# Patient Record
Sex: Male | Born: 1960
Health system: Southern US, Community
[De-identification: ages and names within clinical notes are randomized; demographics above are authoritative.]

## PROBLEM LIST (undated history)

## (undated) DIAGNOSIS — K5190 Ulcerative colitis, unspecified, without complications: Secondary | ICD-10-CM

## (undated) DIAGNOSIS — M109 Gout, unspecified: Secondary | ICD-10-CM

## (undated) DIAGNOSIS — T7840XA Allergy, unspecified, initial encounter: Secondary | ICD-10-CM

## (undated) DIAGNOSIS — I8390 Asymptomatic varicose veins of unspecified lower extremity: Secondary | ICD-10-CM

## (undated) DIAGNOSIS — J45909 Unspecified asthma, uncomplicated: Secondary | ICD-10-CM

## (undated) HISTORY — PX: HERNIA REPAIR: SHX51

## (undated) HISTORY — DX: Allergy, unspecified, initial encounter: T78.40XA

## (undated) HISTORY — DX: Asymptomatic varicose veins of unspecified lower extremity: I83.90

## (undated) HISTORY — DX: Gout, unspecified: M10.9

## (undated) HISTORY — DX: Ulcerative colitis, unspecified, without complications: K51.90

## (undated) HISTORY — DX: Unspecified asthma, uncomplicated: J45.909

---

## 1998-11-19 ENCOUNTER — Ambulatory Visit (HOSPITAL_COMMUNITY): Admission: RE | Admit: 1998-11-19 | Discharge: 1998-11-19 | Payer: Self-pay | Admitting: Gastroenterology

## 2003-03-19 ENCOUNTER — Encounter: Payer: Self-pay | Admitting: Vascular Surgery

## 2003-03-25 ENCOUNTER — Ambulatory Visit (HOSPITAL_COMMUNITY): Admission: RE | Admit: 2003-03-25 | Discharge: 2003-03-25 | Payer: Self-pay | Admitting: Vascular Surgery

## 2011-10-13 ENCOUNTER — Ambulatory Visit (INDEPENDENT_AMBULATORY_CARE_PROVIDER_SITE_OTHER): Payer: 59

## 2011-10-13 DIAGNOSIS — R509 Fever, unspecified: Secondary | ICD-10-CM

## 2011-10-13 DIAGNOSIS — R062 Wheezing: Secondary | ICD-10-CM

## 2011-12-15 ENCOUNTER — Other Ambulatory Visit: Payer: Self-pay | Admitting: Family Medicine

## 2011-12-15 MED ORDER — SILDENAFIL CITRATE 100 MG PO TABS: 100.0000 mg | ORAL_TABLET | Freq: Every day | ORAL | Status: DC | PRN

## 2012-08-31 ENCOUNTER — Other Ambulatory Visit: Payer: Self-pay | Admitting: Internal Medicine

## 2012-09-01 NOTE — Telephone Encounter (Signed)
Please pull paper chart.

## 2012-09-01 NOTE — Telephone Encounter (Signed)
Chart pulled at nurses station pa pool [phone]

## 2012-10-06 ENCOUNTER — Telehealth: Payer: Self-pay | Admitting: *Deleted

## 2012-10-06 MED ORDER — ALLOPURINOL 300 MG PO TABS: 300.0000 mg | ORAL_TABLET | Freq: Every day | ORAL | Status: DC

## 2012-10-06 NOTE — Telephone Encounter (Signed)
Pt walked in after we closed after another pt had walked out.  Spoke with Dr. Lorelei Pont and she made the decision to give the pt 15 day supply and he must have an ov.  Advised pt that he must have an ov before any more refills.

## 2012-10-29 ENCOUNTER — Ambulatory Visit (INDEPENDENT_AMBULATORY_CARE_PROVIDER_SITE_OTHER): Payer: 59 | Admitting: Physician Assistant

## 2012-10-29 VITALS — BP 150/89 | HR 52 | Temp 98.6°F | Resp 12 | Ht 73.0 in | Wt 258.0 lb

## 2012-10-29 DIAGNOSIS — J45909 Unspecified asthma, uncomplicated: Secondary | ICD-10-CM

## 2012-10-29 DIAGNOSIS — M109 Gout, unspecified: Secondary | ICD-10-CM

## 2012-10-29 MED ORDER — ALLOPURINOL 300 MG PO TABS: 300.0000 mg | ORAL_TABLET | Freq: Every day | ORAL | Status: DC

## 2012-10-29 MED ORDER — ALBUTEROL SULFATE HFA 108 (90 BASE) MCG/ACT IN AERS: 2.0000 | INHALATION_SPRAY | Freq: Four times a day (QID) | RESPIRATORY_TRACT | Status: DC | PRN

## 2012-10-29 NOTE — Progress Notes (Signed)
Patient ID: Darren [name] MRN: [id number], DOB: [birth year], 52 y.o. Date of Encounter: 10/29/2012, 4:18 PM  Primary Physician: No primary provider on file.  Chief Complaint: Medication refill  HPI: 52 y.o. year old male with history below presents for medication refill of his allopurinol and proventil.  1) Gout: Doing well. No flares in 1.5-2 years. Takes medication daily without issue. Currently taking allopurinol 300 mg once daily. No adverse effects. He does drink a moderate amount of wine, as he is in the wine business.   2) Asthma: well controlled. Does not require usage of his albuterol inhaler. Just would like to have a refill to have on hand. No exacerbations.  Has not had a CPE in 2 years. Knows he needs to have this done. Does not have time today. Also mentions a rash along the antecubital region of his right arm. Has been present for 1-2 months. Seems to be improving. Not pruritic. Saw his dermatologist. Given a steroid cream. Has been using this. Not worsening, it is just lingering. He is wondering if he got into something in his yard or burned himself. He does not want a workup today. He is just wondering.     Past Medical History  Diagnosis Date  . Asthma      Home Meds: Prior to Admission medications   Medication Sig Start Date End Date Taking? Authorizing Provider  albuterol (PROVENTIL HFA;VENTOLIN HFA) 108 (90 BASE) MCG/ACT inhaler Inhale 2 puffs into the lungs every 6 (six) hours as needed. 10/29/12  Yes  M , PA-C  allopurinol (ZYLOPRIM) 300 MG tablet Take 1 tablet (300 mg total) by mouth daily. 10/29/12  Yes  M , PA-C  sildenafil (VIAGRA) 100 MG tablet Take 1 tablet (100 mg total) by mouth daily as needed for erectile dysfunction. 12/15/11 01/14/12  Chelle Janalee Dane, PA-C    Allergies: No Known Allergies  History   Social History  . Marital Status: Single    Spouse Name: N/A    Number of Children: N/A  . Years of Education: N/A   Occupational  History  . Not on file.   Social History Main Topics  . Smoking status: Never Smoker   . Smokeless tobacco: Not on file  . Alcohol Use: Yes  . Drug Use: Not on file  . Sexually Active: Not on file   Other Topics Concern  . Not on file   Social History Narrative  . No narrative on file     Review of Systems: Constitutional: negative for chills, fever, night sweats, weight changes, or fatigue  HEENT: negative for vision changes, hearing loss, congestion, rhinorrhea, ST, epistaxis, or sinus pressure Cardiovascular: negative for chest pain or palpitations Respiratory: negative for hemoptysis, wheezing, shortness of breath, or cough Abdominal: negative for abdominal pain, nausea, vomiting, or diarrhea Dermatological: Positive for rash All other systems reviewed and negative.   Physical Exam: Blood pressure 150/89, pulse 52, temperature 98.6 F (37 C), temperature source Oral, resp. rate 12, height 6' 1"  (1.854 m), weight 258 lb (117.028 kg), SpO2 99.00%., Body mass index is 34.04 kg/(m^2). BP recheck 131/83 General: Well developed, well nourished, in no acute distress. Head: Normocephalic, atraumatic, eyes without discharge, sclera non-icteric, nares are without discharge. Bilateral auditory canals clear, TM's are without perforation, pearly grey and translucent with reflective cone of light bilaterally. Oral cavity moist, posterior pharynx without exudate, erythema, peritonsillar abscess, or post nasal drip. Uvula midline.  Neck: Supple. No thyromegaly. Full ROM. No lymphadenopathy. Lungs: Clear bilaterally  to auscultation without wheezes, rales, or rhonchi. Breathing is unlabored. Heart: RRR with S1 S2. No murmurs, rubs, or gallops appreciated. Msk:  Strength and tone normal for age. Extremities/Skin: Three erythematous discoid lesions along the right antecubital. Medial most lesion has fungal appearance. No signs of secondary infection. Lateral lesion is mildly erythematous and  patient reports this is improvement and "fading." Skin is intact, warm, and dry.   Neuro: Alert and oriented X 3. Moves all extremities spontaneously. Gait is normal. CNII-XII grossly in tact. Psych:  Responds to questions appropriately with a normal affect.   Labs: CMP and uric acid pending.  Declines KOH.  ASSESSMENT AND PLAN:  52 y.o. year old male with gout, asthma, elevated BP, and rash. 1) Gout -Well controlled -Refilled Allopurinol 300 mg 1 po daily #90 RF 3 -Await labs  2) Asthma -Well controlled -Does not require usage of albuterol inhaler at baseline -Proventil 2 puffs inhaled q 4-6 hours prn #1 RF 2  3) Elevated blood pressure without diagnosis of hypertension -Improved blood pressure on recheck -Monitor -Patient to check at local stores, if remains elevated he is to RTC -Improve diet -Cut down some of the alcohol  -Continue exercise  4) Rash -Has fungal appearance  -Declines KOH -Advised OTC antifungal at this time, if no improvement RTC for further evaluation to include biopsy -Already treated with steroid from dermatology   5) Needs CPE -Last CPE 2 years ago, he agrees to come back in for a CPE and fasting labs. He cannot stay today as he is on his way to the gym -Needs fasting labs -Family history of colon cancer, patient's screenings thus far have all been ok he reports. He believes he may be due for his next screening. Advised him to be on the lookout for a mailing from the GI office. He can also contact the office to find out when his next screening is due.     Signed, Christell Faith, PA-C 10/29/2012 4:18 PM

## 2012-10-30 LAB — COMPREHENSIVE METABOLIC PANEL
ALT: 38 U/L (ref 0–53)
AST: 41 U/L — ABNORMAL HIGH (ref 0–37)
Albumin: 5.1 g/dL (ref 3.5–5.2)
Alkaline Phosphatase: 56 U/L (ref 39–117)
BUN: 17 mg/dL (ref 6–23)
CO2: 29 mEq/L (ref 19–32)
Calcium: 10.2 mg/dL (ref 8.4–10.5)
Chloride: 102 mEq/L (ref 96–112)
Creat: 0.91 mg/dL (ref 0.50–1.35)
Glucose, Bld: 93 mg/dL (ref 70–99)
Potassium: 4.2 mEq/L (ref 3.5–5.3)
Sodium: 143 mEq/L (ref 135–145)
Total Bilirubin: 1 mg/dL (ref 0.3–1.2)
Total Protein: 7.4 g/dL (ref 6.0–8.3)

## 2012-10-30 LAB — URIC ACID: Uric Acid, Serum: 7.8 mg/dL (ref 4.0–7.8)

## 2012-11-02 ENCOUNTER — Telehealth: Payer: Self-pay

## 2012-11-02 NOTE — Telephone Encounter (Signed)
Erroneous encounter

## 2013-04-08 ENCOUNTER — Ambulatory Visit (INDEPENDENT_AMBULATORY_CARE_PROVIDER_SITE_OTHER): Payer: 59 | Admitting: Family Medicine

## 2013-04-08 ENCOUNTER — Ambulatory Visit: Payer: 59

## 2013-04-08 VITALS — BP 143/86 | HR 57 | Temp 97.7°F | Resp 15

## 2013-04-08 DIAGNOSIS — IMO0002 Reserved for concepts with insufficient information to code with codable children: Secondary | ICD-10-CM

## 2013-04-08 DIAGNOSIS — S29011A Strain of muscle and tendon of front wall of thorax, initial encounter: Secondary | ICD-10-CM

## 2013-04-08 DIAGNOSIS — R079 Chest pain, unspecified: Secondary | ICD-10-CM

## 2013-04-08 LAB — POCT CBC
Granulocyte percent: 72.2 %G (ref 37–80)
HCT, POC: 44.5 % (ref 43.5–53.7)
Hemoglobin: 14.4 g/dL (ref 14.1–18.1)
Lymph, poc: 1.2 (ref 0.6–3.4)
MCH, POC: 31.6 pg — AB (ref 27–31.2)
MCHC: 32.4 g/dL (ref 31.8–35.4)
MCV: 97.5 fL — AB (ref 80–97)
MID (cbc): 0.3 (ref 0–0.9)
MPV: 9.1 fL (ref 0–99.8)
POC Granulocyte: 4 (ref 2–6.9)
POC LYMPH PERCENT: 21.6 %L (ref 10–50)
POC MID %: 6.2 %M (ref 0–12)
Platelet Count, POC: 140 10*3/uL — AB (ref 142–424)
RBC: 4.56 M/uL — AB (ref 4.69–6.13)
RDW, POC: 13.7 %
WBC: 5.6 10*3/uL (ref 4.6–10.2)

## 2013-04-08 LAB — COMPREHENSIVE METABOLIC PANEL
ALT: 40 U/L (ref 0–53)
AST: 47 U/L — ABNORMAL HIGH (ref 0–37)
Albumin: 4.5 g/dL (ref 3.5–5.2)
Alkaline Phosphatase: 56 U/L (ref 39–117)
BUN: 15 mg/dL (ref 6–23)
CO2: 28 mEq/L (ref 19–32)
Calcium: 9.7 mg/dL (ref 8.4–10.5)
Chloride: 101 mEq/L (ref 96–112)
Creat: 0.81 mg/dL (ref 0.50–1.35)
Glucose, Bld: 97 mg/dL (ref 70–99)
Potassium: 4.1 mEq/L (ref 3.5–5.3)
Sodium: 140 mEq/L (ref 135–145)
Total Bilirubin: 0.8 mg/dL (ref 0.3–1.2)
Total Protein: 6.4 g/dL (ref 6.0–8.3)

## 2013-04-08 NOTE — Progress Notes (Signed)
[place], [place]  [id number]   [id number]  Subjective:    Patient ID: Darren [name], male    DOB: 06/20/1961, 52 y.o.   MRN: [id number]  HPI This 52 y.o. male presents for evaluation of chest pain.  Onset two hours ago while cutting the grass. Chest pain was L sided pain; describes as a twinge of pain; sharp shooting pains that lasted seconds; had 8 episodes while cutting grass; pain did start while turning mower.  No associated radiation into jaw or shoulder or back; no associated diaphoresis, SOB, nausea.  Took 3 ASA 316m tablets en route.  Severity of pain while mowing grass of 3/10; now 3/10 during visit; pain free now.  Travels a lot with work; last travel six weeks ago.  No leg or calf pain or swelling.   Awoke this morning and took two dogs for a walk; very vigorous walk with dogs without chest pain.  Exercising three days per week on average without chest pain, fatigue, SOB.  Did exercise very vigorously last night; did weights upper body including bench press, push ups; hopes that just pulled a muscle.  Also exercised 30 minutes on elliptical followed by stationary bike.  No tobacco use history.  No family history of heart disease.   Review of Systems  Constitutional: Negative for fever, chills, diaphoresis and fatigue.  Respiratory: Positive for chest tightness. Negative for choking, shortness of breath, wheezing and stridor.   Cardiovascular: Positive for chest pain. Negative for palpitations and leg swelling.  Gastrointestinal: Negative for nausea, vomiting and abdominal pain.  Musculoskeletal: Negative for myalgias, back pain, joint swelling, arthralgias and gait problem.  Skin: Negative for rash.    Past Medical History  Diagnosis Date  . Asthma   . Gout   . Ulcerative colitis     followed by Buccini    Past Surgical History  Procedure Laterality Date  . Hernia repair      Prior to Admission medications   Medication Sig Start Date End Date Taking?  Authorizing Provider  albuterol (PROVENTIL HFA;VENTOLIN HFA) 108 (90 BASE) MCG/ACT inhaler Inhale 2 puffs into the lungs every 6 (six) hours as needed. 10/29/12   RRise Mu PA-C  allopurinol (ZYLOPRIM) 300 MG tablet Take 1 tablet (300 mg total) by mouth daily. 10/29/12   RRise Mu PA-C  sildenafil (VIAGRA) 100 MG tablet Take 1 tablet (100 mg total) by mouth daily as needed for erectile dysfunction. 12/15/11 01/14/12  Chelle SJanalee Dane PA-C    No Known Allergies  History   Social History  . Marital Status: Single    Spouse Name: N/A    Number of Children: N/A  . Years of Education: N/A   Occupational History  . Not on file.   Social History Main Topics  . Smoking status: Never Smoker   . Smokeless tobacco: Not on file  . Alcohol Use: Yes  . Drug Use: Not on file  . Sexually Active: Not on file   Other Topics Concern  . Not on file   Social History Narrative  . No narrative on file    Family History  Problem Relation Age of Onset  . Cancer Father     colon cancer       Objective:   Physical Exam  Nursing note and vitals reviewed. Constitutional: He is oriented to person, place, and time. He appears well-developed and well-nourished. No distress.  Eyes: Conjunctivae are normal. Pupils are equal, round,  and reactive to light.  Neck: Normal range of motion. Neck supple. No JVD present. No thyromegaly present.  Cardiovascular: Normal rate, regular rhythm, normal heart sounds and intact distal pulses.  Exam reveals no gallop and no friction rub.   No murmur heard. Pulmonary/Chest: Effort normal and breath sounds normal. He has no wheezes. He has no rales.  Abdominal: Soft. Bowel sounds are normal. He exhibits no distension and no mass. There is no tenderness. There is no rebound and no guarding.  Musculoskeletal:       Right shoulder: He exhibits pain.       Left shoulder: Normal.       Cervical back: He exhibits pain.  Cervical spine: non-tender; pain in L upper  anterior chest reproducible with rotation to L side;  SHOULDERS: L anterior upper chest pain reproduced with cross over of R arm.  Motor 5/5. Chest wall:  Non-tender along sternoclavicular junction; no TTP along L anterior chest pain.  Lymphadenopathy:    He has no cervical adenopathy.  Neurological: He is alert and oriented to person, place, and time.  Skin: Skin is warm and dry. He is not diaphoretic.  No diaphoresis.  Psychiatric: He has a normal mood and affect. His behavior is normal.   EKG:  Sinus bradycardia at 58; no acute ST changes.  Results for orders placed in visit on 04/08/13  POCT CBC      Result Value Range   WBC 5.6  4.6 - 10.2 K/uL   Lymph, poc 1.2  0.6 - 3.4   POC LYMPH PERCENT 21.6  10 - 50 %L   MID (cbc) 0.3  0 - 0.9   POC MID % 6.2  0 - 12 %M   POC Granulocyte 4.0  2 - 6.9   Granulocyte percent 72.2  37 - 80 %G   RBC 4.56 (*) 4.69 - 6.13 M/uL   Hemoglobin 14.4  14.1 - 18.1 g/dL   HCT, POC 44.5  43.5 - 53.7 %   MCV 97.5 (*) 80 - 97 fL   MCH, POC 31.6 (*) 27 - 31.2 pg   MCHC 32.4  31.8 - 35.4 g/dL   RDW, POC 13.7     Platelet Count, POC 140 (*) 142 - 424 K/uL   MPV 9.1  0 - 99.8 fL   [facility] reading (PRIMARY) by  Dr. Tamala Julian. CXR:  NAD.     Assessment & Plan:  Chest pain - Plan: EKG 12-Lead, Comprehensive metabolic panel, DG Chest 2 View, POCT CBC  Chest Wall Strain  1.  Chest pain:  New.  Atypical; exertion yet onset with range of motion of upper body.  Consistent with chest wall strain following vigorous upper body weight-lifting the night before.  Stable EKG and CXR.  Call 911 for development of chest pain with diaphoresis, SOB, nausea.  Cardiac risk factors include age, obesity.   2.  Chest Wall Strain:  New.  Chest pain reproducible with range of motion of shoulder R and neck.  Recommend rest, heat, range of motion exercises.

## 2013-04-23 ENCOUNTER — Encounter: Payer: Self-pay | Admitting: Internal Medicine

## 2013-05-28 ENCOUNTER — Other Ambulatory Visit: Payer: Self-pay | Admitting: Gastroenterology

## 2013-11-30 ENCOUNTER — Other Ambulatory Visit: Payer: Self-pay | Admitting: Physician Assistant

## 2014-01-06 ENCOUNTER — Other Ambulatory Visit: Payer: Self-pay | Admitting: Physician Assistant

## 2014-01-14 ENCOUNTER — Ambulatory Visit (INDEPENDENT_AMBULATORY_CARE_PROVIDER_SITE_OTHER): Payer: 59 | Admitting: Family Medicine

## 2014-01-14 VITALS — BP 130/80 | HR 68 | Temp 97.6°F | Resp 16 | Ht 72.5 in | Wt 261.0 lb

## 2014-01-14 DIAGNOSIS — R0982 Postnasal drip: Secondary | ICD-10-CM

## 2014-01-14 DIAGNOSIS — J209 Acute bronchitis, unspecified: Secondary | ICD-10-CM

## 2014-01-14 DIAGNOSIS — N529 Male erectile dysfunction, unspecified: Secondary | ICD-10-CM | POA: Insufficient documentation

## 2014-01-14 DIAGNOSIS — K5190 Ulcerative colitis, unspecified, without complications: Secondary | ICD-10-CM | POA: Insufficient documentation

## 2014-01-14 DIAGNOSIS — M109 Gout, unspecified: Secondary | ICD-10-CM

## 2014-01-14 DIAGNOSIS — J019 Acute sinusitis, unspecified: Secondary | ICD-10-CM

## 2014-01-14 MED ORDER — CEFDINIR 300 MG PO CAPS: 300.0000 mg | ORAL_CAPSULE | Freq: Two times a day (BID) | ORAL | Status: DC

## 2014-01-14 MED ORDER — SILDENAFIL CITRATE 100 MG PO TABS: 100.0000 mg | ORAL_TABLET | Freq: Every day | ORAL | Status: DC | PRN

## 2014-01-14 MED ORDER — ALLOPURINOL 300 MG PO TABS: 300.0000 mg | ORAL_TABLET | Freq: Every day | ORAL | Status: DC

## 2014-01-14 MED ORDER — MESALAMINE 800 MG PO TBEC: DELAYED_RELEASE_TABLET | ORAL | Status: DC

## 2014-01-14 MED ORDER — IPRATROPIUM BROMIDE 0.03 % NA SOLN: 2.0000 | Freq: Four times a day (QID) | NASAL | Status: DC

## 2014-01-14 NOTE — Patient Instructions (Signed)
Use the omnicef antibiotic as directed for your sinuses and chest.  Let me know if you are not better soon Try the atrovent nasal spray as needed for post- nasal drip and mucus Continue taking your allopurinol and asacol as usual.

## 2014-01-14 NOTE — Progress Notes (Signed)
[facility] [address], [place] [address] [phone]  Date:  01/14/2014   Name:  Darren [name]   DOB:  [birth year]   MRN:  [id number]  PCP:  No primary provider on file.    Chief Complaint: Sinusitis   History of Present Illness:  Darren [name] is a 53 y.o. very pleasant male patient who presents with the following:  He is here today with illness.  He has PND that is bothering him a lot at night.  He is blowing out and coughing up some green mucus.  He has been sick for about 4 days. He has not noted any fever.  He is able to do his activities but just "feels lousy."  His asthma is still ok, but he is worried that this will get worse.  He has not needed to use his albuterol since he has been sick.  He does have UC; this is under good control currently per Dr. Cristina Gong.  He uses asacol but admits he does not use it quite like prescribed because it is expensive  No history of BPH or glaucoma.   He uses allopurinol for gout and has done so for years with good results.  He also use viagra prn and could use a refill of this  There are no active problems to display for this patient.   Past Medical History  Diagnosis Date  . Asthma   . Gout   . Ulcerative colitis     followed by Buccini  . Varicose veins     Past Surgical History  Procedure Laterality Date  . Hernia repair      History  Substance Use Topics  . Smoking status: Never Smoker   . Smokeless tobacco: Not on file  . Alcohol Use: Yes    Family History  Problem Relation Age of Onset  . Cancer Father     colon cancer    No Known Allergies  Medication list has been reviewed and updated.  Current Outpatient Prescriptions on File Prior to Visit  Medication Sig Dispense Refill  . albuterol (PROVENTIL HFA;VENTOLIN HFA) 108 (90 BASE) MCG/ACT inhaler Inhale 2 puffs into the lungs every 6 (six) hours as needed.  1 Inhaler  2  . allopurinol (ZYLOPRIM) 300 MG tablet Take 1 tablet (300 mg  total) by mouth daily. PATIENT NEEDS OFFICE VISIT FOR ADDITIONAL REFILLS  30 tablet  0  . sildenafil (VIAGRA) 100 MG tablet Take 1 tablet (100 mg total) by mouth daily as needed for erectile dysfunction.  10 tablet  0   No current facility-administered medications on file prior to visit.    Review of Systems:  As per HPI- otherwise negative.   Physical Examination: Filed Vitals:   01/14/14 1305  BP: 130/80  Pulse: 68  Temp: 97.6 F (36.4 C)  Resp: 16   Filed Vitals:   01/14/14 1305  Height: 6' 0.5" (1.842 m)  Weight: 261 lb (118.389 kg)   Body mass index is 34.89 kg/(m^2). Ideal Body Weight: Weight in (lb) to have BMI = 25: 186.5  GEN: WDWN, NAD, Non-toxic, A & O x 3, obese, appears congested HEENT: Atraumatic, Normocephalic. Neck supple. No masses, No LAD.  Bilateral TM wnl, oropharynx normal.  PEERL,EOMI.   Nasal congestion Ears and Nose: No external deformity. CV: RRR, No M/G/R. No JVD. No thrill. No extra heart sounds. PULM: CTA B, no wheezes, crackles, rhonchi. No retractions. No resp. distress. No accessory muscle use. ABD: S,  NT, ND, +BS. No rebound. No HSM. EXTR: No c/c/e NEURO Normal gait.  PSYCH: Normally interactive. Conversant. Not depressed or anxious appearing.  Calm demeanor.    Assessment and Plan: Acute bronchitis - Plan: ipratropium (ATROVENT) 0.03 % nasal spray, cefdinir (OMNICEF) 300 MG capsule  Gout - Plan: allopurinol (ZYLOPRIM) 300 MG tablet  Sinusitis, acute - Plan: cefdinir (OMNICEF) 300 MG capsule  PND (post-nasal drip) - Plan: ipratropium (ATROVENT) 0.03 % nasal spray  Erectile dysfunction - Plan: sildenafil (VIAGRA) 100 MG tablet  Ulcerative colitis, unspecified - Plan: Mesalamine 800 MG TBEC  Discussed with pt.  Refilled medications as above per his request.  Atrovent nasal for PND.  It is likely that he has a viral illness, and he can wait on taking the omnicef for a few more days if desired.  However he does have asthma so he is more  leery of complications.    See patient instructions for more details.      Signed Lamar Blinks, MD

## 2014-06-04 ENCOUNTER — Ambulatory Visit (INDEPENDENT_AMBULATORY_CARE_PROVIDER_SITE_OTHER): Payer: 59 | Admitting: Internal Medicine

## 2014-06-04 VITALS — BP 112/76 | HR 61 | Temp 98.1°F | Resp 20 | Ht 72.5 in | Wt 260.8 lb

## 2014-06-04 DIAGNOSIS — J4521 Mild intermittent asthma with (acute) exacerbation: Secondary | ICD-10-CM

## 2014-06-04 DIAGNOSIS — J45901 Unspecified asthma with (acute) exacerbation: Secondary | ICD-10-CM

## 2014-06-04 MED ORDER — AZITHROMYCIN 500 MG PO TABS: 500.0000 mg | ORAL_TABLET | Freq: Every day | ORAL | Status: DC

## 2014-06-04 MED ORDER — ALBUTEROL SULFATE HFA 108 (90 BASE) MCG/ACT IN AERS: 2.0000 | INHALATION_SPRAY | Freq: Four times a day (QID) | RESPIRATORY_TRACT | Status: DC | PRN

## 2014-06-04 NOTE — Progress Notes (Signed)
Subjective:  This chart was scribed for Tami Lin, MD, by Neta Ehlers, ED Scribe. The patient's care was started at 5:29 PM.   Patient ID: Darren [name], male    DOB: 1961-03-11, 53 y.o.   MRN: [id number]  HPI  HPI Comments: Darren [name] is a 53 y.o. male who presents to the [facility] complaining of chest tightness which has persisted for several days. The pt reports a recent illness which began in his chest. He endorses mild "popping" to the ears and sleep interference. He denies fever, nasal congestion, or SOB. Darren [name] has treated the symptoms with steam and OTC medication.   He denies current seasonal allergies.   He reports a h/o asthma though he has not utilized the inhaler recently and reports the prescription has expired.   He uses allopurinol daily , though he is occasionally noncompliant.  Darren [name] endorses ulcerative colitis which is followed by Dr. Cristina Gong and which is treated with Mesalamine. He denies colectomy. He endorses a family h/o colon cancer.   Past Medical History  Diagnosis Date  . Asthma   . Gout   . Ulcerative colitis     followed by Buccini  . Varicose veins    Past Surgical History  Procedure Laterality Date  . Hernia repair     Current Outpatient Prescriptions on File Prior to Visit  Medication Sig Dispense Refill  . albuterol (PROVENTIL HFA;VENTOLIN HFA) 108 (90 BASE) MCG/ACT inhaler Inhale 2 puffs into the lungs every 6 (six) hours as needed.  1 Inhaler  2  . allopurinol (ZYLOPRIM) 300 MG tablet Take 1 tablet (300 mg total) by mouth daily.  90 tablet  3  . Mesalamine 800 MG TBEC Take 3 tablets twice daily  180 tablet  6  . sildenafil (VIAGRA) 100 MG tablet Take 1 tablet (100 mg total) by mouth daily as needed for erectile dysfunction.  10 tablet  9   No current facility-administered medications on file prior to visit.     Review of Systems See HPI.     Objective:   Physical Exam  Pulmonary/Chest: Effort normal.      Physical Exam  Nursing note and vitals reviewed. Constitutional: He is oriented to person, place, and time. He appears well-developed and well-nourished. No distress.  HENT: TMs intact Head: Normocephalic and atraumatic.  Eyes: Conjunctivae and EOM are normal.  Neck: Neck supple. No adenopathy Throat clear  Cardiovascular: Normal rate.   Pulmonary/Chest: Effort normal. No respiratory distress. Wheezing bilaterally on forced expiration /mild  Musculoskeletal: Normal range of motion.  Neurological: He is alert and oriented to person, place, and time.  Skin: Skin is warm and dry.  Psychiatric: He has a normal mood and affect. His behavior is normal.  Vitals: BP 112/76  Pulse 61  Temp(Src) 98.1 F (36.7 C) (Oral)  Resp 20  Ht 6' 0.5" (1.842 m)  Wt 260 lb 12.8 oz (118.298 kg)  BMI 34.87 kg/m2  SpO2 97%     Assessment & Plan:  The plan includes a Z-pack and an inhaler.  I have completed the patient encounter in its entirety as documented by the scribe, with editing by me where necessary.  P. Laney Pastor, M.D. Asthma, mild intermittent, with acute exacerbation - Plan: albuterol (PROVENTIL HFA;VENTOLIN HFA) 108 (90 BASE) MCG/ACT inhaler  Meds ordered this encounter  Medications  . albuterol (PROVENTIL HFA;VENTOLIN HFA) 108 (90 BASE) MCG/ACT inhaler    Sig: Inhale 2 puffs into the lungs every 6 (six) hours as needed.  Dispense:  1 Inhaler    Refill:  2  . azithromycin (ZITHROMAX) 500 MG tablet    Sig: Take 1 tablet (500 mg total) by mouth daily.    Dispense:  5 tablet    Refill:  0

## 2014-06-10 ENCOUNTER — Telehealth: Payer: Self-pay

## 2014-06-10 NOTE — Telephone Encounter (Signed)
DOOLITTLE - Pt says his antibiotic is not helping him.  Still coughing up stuff and just not feeling well.  Says the z-pack didn't have 2 pills for the first day like most he has taken.  There was only one.  Can we call in something stronger?  [phone] (office) or cell [phone]

## 2014-06-11 ENCOUNTER — Ambulatory Visit (INDEPENDENT_AMBULATORY_CARE_PROVIDER_SITE_OTHER): Payer: 59 | Admitting: Family Medicine

## 2014-06-11 ENCOUNTER — Ambulatory Visit (INDEPENDENT_AMBULATORY_CARE_PROVIDER_SITE_OTHER): Payer: 59

## 2014-06-11 VITALS — BP 126/82 | HR 61 | Temp 98.1°F | Resp 20 | Ht 72.5 in | Wt 262.2 lb

## 2014-06-11 DIAGNOSIS — R05 Cough: Secondary | ICD-10-CM

## 2014-06-11 DIAGNOSIS — R062 Wheezing: Secondary | ICD-10-CM

## 2014-06-11 DIAGNOSIS — R059 Cough, unspecified: Secondary | ICD-10-CM

## 2014-06-11 DIAGNOSIS — J22 Unspecified acute lower respiratory infection: Secondary | ICD-10-CM

## 2014-06-11 DIAGNOSIS — J45909 Unspecified asthma, uncomplicated: Secondary | ICD-10-CM

## 2014-06-11 DIAGNOSIS — J988 Other specified respiratory disorders: Secondary | ICD-10-CM

## 2014-06-11 MED ORDER — LEVOFLOXACIN 500 MG PO TABS: 500.0000 mg | ORAL_TABLET | Freq: Every day | ORAL | Status: DC

## 2014-06-11 MED ORDER — IPRATROPIUM-ALBUTEROL 0.5-2.5 (3) MG/3ML IN SOLN
3.0000 mL | Freq: Once | RESPIRATORY_TRACT | Status: AC
  Administered 2014-06-11: 3 mL via RESPIRATORY_TRACT

## 2014-06-11 NOTE — Telephone Encounter (Signed)
Per OV pt needs to RTC if not improving. LM on cell phone.

## 2014-06-11 NOTE — Progress Notes (Signed)
Chief Complaint:  Chief Complaint  Patient presents with  . Follow-up    finished antibiotics-still coughing up green phelgm/fatigue    HPI: Darren [name] is a 53 y.o. male who is here for  A non improving productive cough x 1 week, he was on a z pack and then also an albuterol inhaler but has not used his inhaler except once, he is complaining of bringing up green sputum, he feels it in his chest and sicne he ahs a hsitory of asthma he is worried about it. No xrays were taken at the last OV. He has had some wheezing but minimal and he states he ahs been exercising. He states his Asthma sxs were much worse when he lived in [place]. He has not had any fevers or chills. He just felt "funny and tired ". He used to be on more meds and more allergies prior to coming down here. 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 Aparrently he was able to be on the elipitical and was not SOB . He states he felt better although he did have some wheezing last night. Again he has only used in albuterol once. He feesl the meds do not help his asthma.    Past Medical History  Diagnosis Date  . Asthma   . Gout   . Ulcerative colitis     followed by Buccini  . Varicose veins    Past Surgical History  Procedure Laterality Date  . Hernia repair     History   Social History  . Marital Status: Single    Spouse Name: N/A    Number of Children: N/A  . Years of Education: N/A   Social History Main Topics  . Smoking status: Never Smoker   . Smokeless tobacco: Never Used  . Alcohol Use: Yes  . Drug Use: No  . Sexual Activity: None   Other Topics Concern  . None   Social History Narrative  . None   Family History  Problem Relation Age of Onset  . Cancer Father     colon cancer   No Known Allergies Prior to Admission medications   Medication Sig Start Date End Date Taking? Authorizing Provider  albuterol (PROVENTIL HFA;VENTOLIN HFA)  108 (90 BASE) MCG/ACT inhaler Inhale 2 puffs into the lungs every 6 (six) hours as needed. 06/04/14  Yes Leandrew Koyanagi, MD  allopurinol (ZYLOPRIM) 300 MG tablet Take 1 tablet (300 mg total) by mouth daily. 01/14/14  Yes Darreld Mclean, MD  Mesalamine 800 MG TBEC Take 2 tablets daily 01/14/14  Yes Gay Filler Copland, MD  sildenafil (VIAGRA) 100 MG tablet Take 1 tablet (100 mg total) by mouth daily as needed for erectile dysfunction. 01/14/14 02/13/14  Gay Filler Copland, MD     ROS: The patient denies fevers, chills, night sweats, unintentional weight loss, chest pain, palpitations, dyspnea on exertion, nausea, vomiting, abdominal pain, dysuria, hematuria, melena, numbness, weakness, or tingling.   All other systems have been reviewed and were otherwise negative with the exception of those mentioned in the HPI and as above.    PHYSICAL EXAM: Filed Vitals:   06/11/14 1815  BP: 126/82  Pulse: 61  Temp: 98.1 F (36.7 C)  Resp: 20  SPo2 95% Filed Vitals:   06/11/14 1815  Height: 6' 0.5" (1.842 m)  Weight: 262 lb 4 oz (118.956 kg)   Body mass  index is 35.06 kg/(m^2).  General: Alert, no acute distress HEENT:  Normocephalic, atraumatic, oropharynx patent. EOMI, PERRLA. Tm nl, no exudates, he ahs some moderate PND Cardiovascular:  Regular rate and rhythm, no rubs murmurs or gallops.  No Carotid bruits, radial pulse intact. No pedal edema.  Respiratory: Clear to auscultation bilaterally.  No wheezes, rales, or rhonchi.  No cyanosis, no use of accessory musculature. Decrease BS.  GI: No organomegaly, abdomen is soft and non-tender, positive bowel sounds.  No masses. Skin: No rashes. Neurologic: Facial musculature symmetric. Psychiatric: Patient is appropriate throughout our interaction. Lymphatic: No cervical lymphadenopathy Musculoskeletal: Gait intact. 5/5 strength He has no pedal edema, he does have large varicose veins.    LABS: Results for orders placed in visit on 04/08/13    COMPREHENSIVE METABOLIC PANEL      Result Value Ref Range   Sodium 140  135 - 145 mEq/L   Potassium 4.1  3.5 - 5.3 mEq/L   Chloride 101  96 - 112 mEq/L   CO2 28  19 - 32 mEq/L   Glucose, Bld 97  70 - 99 mg/dL   BUN 15  6 - 23 mg/dL   Creat 0.81  0.50 - 1.35 mg/dL   Total Bilirubin 0.8  0.3 - 1.2 mg/dL   Alkaline Phosphatase 56  39 - 117 U/L   AST 47 (*) 0 - 37 U/L   ALT 40  0 - 53 U/L   Total Protein 6.4  6.0 - 8.3 g/dL   Albumin 4.5  3.5 - 5.2 g/dL   Calcium 9.7  8.4 - 10.5 mg/dL  POCT CBC      Result Value Ref Range   WBC 5.6  4.6 - 10.2 K/uL   Lymph, poc 1.2  0.6 - 3.4   POC LYMPH PERCENT 21.6  10 - 50 %L   MID (cbc) 0.3  0 - 0.9   POC MID % 6.2  0 - 12 %M   POC Granulocyte 4.0  2 - 6.9   Granulocyte percent 72.2  37 - 80 %G   RBC 4.56 (*) 4.69 - 6.13 M/uL   Hemoglobin 14.4  14.1 - 18.1 g/dL   HCT, POC 44.5  43.5 - 53.7 %   MCV 97.5 (*) 80 - 97 fL   MCH, POC 31.6 (*) 27 - 31.2 pg   MCHC 32.4  31.8 - 35.4 g/dL   RDW, POC 13.7     Platelet Count, POC 140 (*) 142 - 424 K/uL   MPV 9.1  0 - 99.8 fL     EKG/XRAY:   Primary read interpreted by Dr. Marin Comment at [facility]. No active cardiopulmonary disease when compared to 04/08/2013   ASSESSMENT/PLAN: Encounter Diagnoses  Name Primary?  . Wheezing   . Cough   . Lower respiratory infection Yes  . Asthma, chronic, unspecified asthma severity, uncomplicated    Rx Levaquin 500 mg x  days Take albuterol scheduled until he feel better Spo2 went from 95% to 98%  and BS were improved after1 duoneb treatment  F/u prn  Gross sideeffects, risk and benefits, and alternatives of medications d/w patient. Patient is aware that all medications have potential sideeffects and we are unable to predict every sideeffect or drug-drug interaction that may occur.  , Evan, DO 06/12/2014 2:17 PM

## 2014-11-28 ENCOUNTER — Ambulatory Visit (INDEPENDENT_AMBULATORY_CARE_PROVIDER_SITE_OTHER): Payer: BLUE CROSS/BLUE SHIELD | Admitting: Physician Assistant

## 2014-11-28 VITALS — BP 130/80 | HR 51 | Temp 98.2°F | Resp 17 | Ht 72.5 in | Wt 257.0 lb

## 2014-11-28 DIAGNOSIS — I8393 Asymptomatic varicose veins of bilateral lower extremities: Secondary | ICD-10-CM

## 2014-11-28 DIAGNOSIS — M1000 Idiopathic gout, unspecified site: Secondary | ICD-10-CM

## 2014-11-28 DIAGNOSIS — Z130 Encounter for screening for diseases of the blood and blood-forming organs and certain disorders involving the immune mechanism: Secondary | ICD-10-CM

## 2014-11-28 DIAGNOSIS — J4521 Mild intermittent asthma with (acute) exacerbation: Secondary | ICD-10-CM

## 2014-11-28 DIAGNOSIS — Z125 Encounter for screening for malignant neoplasm of prostate: Secondary | ICD-10-CM

## 2014-11-28 DIAGNOSIS — J4520 Mild intermittent asthma, uncomplicated: Secondary | ICD-10-CM | POA: Insufficient documentation

## 2014-11-28 DIAGNOSIS — Z13228 Encounter for screening for other metabolic disorders: Secondary | ICD-10-CM

## 2014-11-28 DIAGNOSIS — N529 Male erectile dysfunction, unspecified: Secondary | ICD-10-CM

## 2014-11-28 DIAGNOSIS — Z0000 Encounter for general adult medical examination without abnormal findings: Secondary | ICD-10-CM

## 2014-11-28 DIAGNOSIS — Z1329 Encounter for screening for other suspected endocrine disorder: Secondary | ICD-10-CM

## 2014-11-28 DIAGNOSIS — Z13220 Encounter for screening for lipoid disorders: Secondary | ICD-10-CM

## 2014-11-28 LAB — LIPID PANEL
Cholesterol: 141 mg/dL (ref 0–200)
HDL: 37 mg/dL — ABNORMAL LOW (ref >39)
LDL Cholesterol: 84 mg/dL (ref 0–99)
Total CHOL/HDL Ratio: 3.8 Ratio
Triglycerides: 98 mg/dL (ref <150)
VLDL: 20 mg/dL (ref 0–40)

## 2014-11-28 LAB — CBC WITH DIFFERENTIAL/PLATELET
Basophils Absolute: 0 10*3/uL (ref 0.0–0.1)
Basophils Relative: 0 % (ref 0–1)
Eosinophils Absolute: 0.3 10*3/uL (ref 0.0–0.7)
Eosinophils Relative: 7 % — ABNORMAL HIGH (ref 0–5)
HCT: 45.9 % (ref 39.0–52.0)
Hemoglobin: 15.4 g/dL (ref 13.0–17.0)
Lymphocytes Relative: 24 % (ref 12–46)
Lymphs Abs: 1.1 10*3/uL (ref 0.7–4.0)
MCH: 30.4 pg (ref 26.0–34.0)
MCHC: 33.6 g/dL (ref 30.0–36.0)
MCV: 90.5 fL (ref 78.0–100.0)
MPV: 10.4 fL (ref 8.6–12.4)
Monocytes Absolute: 0.3 10*3/uL (ref 0.1–1.0)
Monocytes Relative: 7 % (ref 3–12)
Neutro Abs: 2.8 10*3/uL (ref 1.7–7.7)
Neutrophils Relative %: 62 % (ref 43–77)
Platelets: 171 10*3/uL (ref 150–400)
RBC: 5.07 MIL/uL (ref 4.22–5.81)
RDW: 14 % (ref 11.5–15.5)
WBC: 4.5 10*3/uL (ref 4.0–10.5)

## 2014-11-28 LAB — COMPLETE METABOLIC PANEL WITH GFR
ALT: 74 U/L — ABNORMAL HIGH (ref 0–53)
AST: 88 U/L — ABNORMAL HIGH (ref 0–37)
Albumin: 5 g/dL (ref 3.5–5.2)
Alkaline Phosphatase: 64 U/L (ref 39–117)
BUN: 18 mg/dL (ref 6–23)
CO2: 31 mEq/L (ref 19–32)
Calcium: 10 mg/dL (ref 8.4–10.5)
Chloride: 103 mEq/L (ref 96–112)
Creat: 0.97 mg/dL (ref 0.50–1.35)
GFR, Est African American: >89 mL/min
GFR, Est Non African American: 88 mL/min
Glucose, Bld: 95 mg/dL (ref 70–99)
Potassium: 4.4 mEq/L (ref 3.5–5.3)
Sodium: 142 mEq/L (ref 135–145)
Total Bilirubin: 1.2 mg/dL (ref 0.2–1.2)
Total Protein: 7.5 g/dL (ref 6.0–8.3)

## 2014-11-28 LAB — TSH: TSH: 2.317 u[IU]/mL (ref 0.350–4.500)

## 2014-11-28 MED ORDER — ALLOPURINOL 300 MG PO TABS
300.0000 mg | ORAL_TABLET | Freq: Every day | ORAL | Status: DC
Start: 2014-11-28 — End: 2015-07-16

## 2014-11-28 MED ORDER — ALBUTEROL SULFATE HFA 108 (90 BASE) MCG/ACT IN AERS: 2.0000 | INHALATION_SPRAY | Freq: Four times a day (QID) | RESPIRATORY_TRACT | Status: DC | PRN

## 2014-11-28 MED ORDER — SILDENAFIL CITRATE 100 MG PO TABS: 50.0000 mg | ORAL_TABLET | Freq: Every day | ORAL | Status: DC | PRN

## 2014-11-28 NOTE — Progress Notes (Signed)
  Medical screening examination/treatment/procedure(s) were performed by non-physician practitioner and as supervising physician I was immediately available for consultation/collaboration.     

## 2014-11-28 NOTE — Patient Instructions (Signed)
I will contact you with your lab results as soon as they are available.   If you have not heard from me in 2 weeks, please contact me.  The fastest way to get your results is to register for My Chart (see the instructions on the last page of this printout).

## 2014-11-28 NOTE — Progress Notes (Signed)
Subjective:    Patient ID: Darren [name], male    DOB: 01-Sep-1961, 54 y.o.   MRN: [id number]  HPI Pt presents to clinic for his complete PE.  He has just gotten new insurance and they want him to be evaluated.  He has UC and sees Dr Cristina Gong.  He has erectile dysfunction and sometimes uses Viagra. He has gout and uses Allopurinol every 3 days and then he does not have acute attacks.  He states he rarely uses medications as prescribed bu rather how they work best for him.  He has had varicose veins and had surgery but he has no pain and he does not plan to have his legs fixed for their bad looks.  Eye exam within the last year. Dentist - in January Colonoscopy - 2014  Review of Systems  Constitutional: Negative.   HENT: Negative.   Eyes: Negative.   Respiratory: Negative.   Cardiovascular: Negative.   Gastrointestinal: Negative.   Endocrine: Negative.   Genitourinary: Negative.   Musculoskeletal: Negative.   Skin: Negative.   Allergic/Immunologic: Negative.   Neurological: Negative.   Hematological: Negative.   Psychiatric/Behavioral: Negative.    Patient Active Problem List   Diagnosis Date Noted  . Asthma, mild intermittent 11/28/2014  . Ulcerative colitis 01/14/2014  . Gout 01/14/2014  . Erectile dysfunction 01/14/2014   Prior to Admission medications   Medication Sig Start Date End Date Taking? Authorizing Provider  allopurinol (ZYLOPRIM) 300 MG tablet Take 1 tablet (300 mg total) by mouth daily. 11/28/14  Yes Mancel Bale, PA-C  Mesalamine 800 MG TBEC Take 2 tablets daily 01/14/14  Yes Gay Filler Copland, MD  albuterol (PROVENTIL HFA;VENTOLIN HFA) 108 (90 BASE) MCG/ACT inhaler Inhale 2 puffs into the lungs every 6 (six) hours as needed. 11/28/14   Mancel Bale, PA-C  sildenafil (VIAGRA) 100 MG tablet Take 0.5-1 tablets (50-100 mg total) by mouth daily as needed for erectile dysfunction. 11/28/14   Mancel Bale, PA-C   No Known Allergies  Medications, allergies, past  medical history, surgical history, family history, social history and problem list reviewed and updated.     Objective:   Physical Exam  Constitutional: He is oriented to person, place, and time. He appears well-developed and well-nourished.  BP 130/80 mmHg  Pulse 51  Temp(Src) 98.2 F (36.8 C) (Oral)  Resp 17  Ht 6' 0.5" (1.842 m)  Wt 257 lb (116.574 kg)  BMI 34.36 kg/m2  SpO2 98%   HENT:  Head: Normocephalic and atraumatic.  Right Ear: Hearing, tympanic membrane, external ear and ear canal normal.  Left Ear: Hearing, tympanic membrane, external ear and ear canal normal.  Nose: Nose normal.  Mouth/Throat: Uvula is midline, oropharynx is clear and moist and mucous membranes are normal.  Eyes: Conjunctivae and EOM are normal. Pupils are equal, round, and reactive to light.  Neck: Normal range of motion. Neck supple. No thyromegaly present.  Cardiovascular: Normal rate, regular rhythm and normal heart sounds.   No murmur heard. Pulmonary/Chest: Effort normal and breath sounds normal.  Abdominal: Soft. Bowel sounds are normal.  Genitourinary: Rectum normal, prostate normal and testes normal. Circumcised.  Musculoskeletal: Normal range of motion.  Lymphadenopathy:    He has no cervical adenopathy.  Neurological: He is alert and oriented to person, place, and time.  Skin: Skin is warm and dry.  Psychiatric: He has a normal mood and affect. His behavior is normal. Judgment and thought content normal.      Assessment &  Plan:  Annual physical exam - form will be filled out when his results come back in from the lab  Acute idiopathic gout, unspecified site - Continue the plan Plan: allopurinol (ZYLOPRIM) 300 MG tablet  Screening for cholesterol level - Plan: Lipid panel  Screening for prostate cancer - Plan: PSA  Screening for metabolic disorder - Plan: COMPLETE METABOLIC PANEL WITH GFR  Screening for deficiency anemia - Plan: CBC with Differential/Platelet  Screening for  thyroid disorder - Plan: TSH  Asthma, mild intermittent, with acute exacerbation - Plan: albuterol (PROVENTIL HFA;VENTOLIN HFA) 108 (90 BASE) MCG/ACT inhaler  Impotence - Plan: sildenafil (VIAGRA) 100 MG tablet  Varicose veins of both lower extremities - pt does not want treatment at this time because he has no pain or problems.  Windell Hummingbird PA-C  [facility] 11/28/2014 3:30 PM

## 2014-11-29 LAB — PSA: PSA: 1.88 ng/mL (ref <=4.00)

## 2015-01-21 ENCOUNTER — Ambulatory Visit (INDEPENDENT_AMBULATORY_CARE_PROVIDER_SITE_OTHER): Payer: BLUE CROSS/BLUE SHIELD | Admitting: Physician Assistant

## 2015-01-21 VITALS — BP 128/84 | HR 52 | Temp 97.4°F | Resp 16 | Ht 72.5 in | Wt 262.0 lb

## 2015-01-21 DIAGNOSIS — H938X1 Other specified disorders of right ear: Secondary | ICD-10-CM | POA: Diagnosis not present

## 2015-01-21 DIAGNOSIS — H6121 Impacted cerumen, right ear: Secondary | ICD-10-CM | POA: Diagnosis not present

## 2015-01-21 DIAGNOSIS — H9191 Unspecified hearing loss, right ear: Secondary | ICD-10-CM | POA: Diagnosis not present

## 2015-01-21 NOTE — Progress Notes (Signed)
Subjective:    Patient ID: Darren [name], male    DOB: 1960/12/13, 54 y.o.   MRN: [id number]  HPI  This is a 54 year old male who is presenting with right ear fullness x 4 days. Feels like he has pressure in his ear. He is having decreased hearing. Has poor hearing in left ear since age 12 and is worried about his right ear.  Has some minimal nasal congestion. Has environmental allergies but not bothered too bad now. Not taking anything for allergies. Pt has a history of asthma but only uses albuterol when sick. Exercises without problems breathing. He denies fever, chills, sore throat, cough.  Review of Systems  Constitutional: Negative for fever and chills.  HENT: Positive for congestion and hearing loss. Negative for ear discharge, ear pain, postnasal drip, sinus pressure and sore throat.        Ear fullness  Eyes: Negative for redness.  Respiratory: Negative for cough, shortness of breath and wheezing.   Gastrointestinal: Negative for nausea and vomiting.  Skin: Negative for rash.  Allergic/Immunologic: Positive for environmental allergies.  Hematological: Negative for adenopathy.    Patient Active Problem List   Diagnosis Date Noted  . Asthma, mild intermittent 11/28/2014  . Varicose veins of both lower extremities 11/28/2014  . Ulcerative colitis 01/14/2014  . Gout 01/14/2014  . Erectile dysfunction 01/14/2014   Prior to Admission medications   Medication Sig Start Date End Date Taking? Authorizing Provider  albuterol (PROVENTIL HFA;VENTOLIN HFA) 108 (90 BASE) MCG/ACT inhaler Inhale 2 puffs into the lungs every 6 (six) hours as needed. 11/28/14  Yes Mancel Bale, PA-C  allopurinol (ZYLOPRIM) 300 MG tablet Take 1 tablet (300 mg total) by mouth daily. 11/28/14  Yes Mancel Bale, PA-C  Mesalamine 800 MG TBEC Take 2 tablets daily 01/14/14  Yes Gay Filler Copland, MD  sildenafil (VIAGRA) 100 MG tablet Take 0.5-1 tablets (50-100 mg total) by mouth daily as needed for erectile  dysfunction. 11/28/14  Yes Mancel Bale, PA-C   No Known Allergies  Patient's social and family history were reviewed.     Objective:   Physical Exam  Constitutional: He is oriented to person, place, and time. He appears well-developed and well-nourished. No distress.  HENT:  Head: Normocephalic and atraumatic.  Right Ear: Hearing, external ear and ear canal normal.  Left Ear: Hearing, external ear and ear canal normal.  Nose: Nose normal.  Mouth/Throat: Uvula is midline, oropharynx is clear and moist and mucous membranes are normal.  Right TM completely blocked by cerumen. Left TM partially blocked by cerumen. Cerumen removed bilaterally - symptoms improved with this.  Eyes: Conjunctivae and lids are normal. Right eye exhibits no discharge. Left eye exhibits no discharge. No scleral icterus.  Cardiovascular: Normal rate, regular rhythm, normal heart sounds and normal pulses.   No murmur heard. Pulmonary/Chest: Effort normal. No respiratory distress. He has wheezes (scattered). He has no rhonchi. He has no rales.  Musculoskeletal: Normal range of motion.  Lymphadenopathy:       Head (right side): No submental, no submandibular and no tonsillar adenopathy present.       Head (left side): No submental, no submandibular and no tonsillar adenopathy present.    He has no cervical adenopathy.  Neurological: He is alert and oriented to person, place, and time.  Skin: Skin is warm, dry and intact. No lesion and no rash noted.  Psychiatric: He has a normal mood and affect. His speech is normal and behavior  is normal. Thought content normal.   BP 128/84 mmHg  Pulse 52  Temp(Src) 97.4 F (36.3 C) (Oral)  Resp 16  Ht 6' 0.5" (1.842 m)  Wt 262 lb (118.842 kg)  BMI 35.03 kg/m2  SpO2 97%     Assessment & Plan:  1. Cerumen impaction, right 2. Ear fullness, right 3. Decreased hearing, right Symptoms improved with cerumen removal. He will return with further  problems/concerns.   Benjaman Pott Drenda Freeze, MHS [facility]  01/21/2015

## 2015-01-21 NOTE — Patient Instructions (Signed)
Put hydrogen peroxide in ear for 5 minutes a day for next couple days. Return with further problems.

## 2015-02-25 ENCOUNTER — Other Ambulatory Visit: Payer: Self-pay | Admitting: Family Medicine

## 2015-07-16 ENCOUNTER — Ambulatory Visit (INDEPENDENT_AMBULATORY_CARE_PROVIDER_SITE_OTHER): Payer: BLUE CROSS/BLUE SHIELD

## 2015-07-16 ENCOUNTER — Ambulatory Visit (INDEPENDENT_AMBULATORY_CARE_PROVIDER_SITE_OTHER): Payer: BLUE CROSS/BLUE SHIELD | Admitting: Family Medicine

## 2015-07-16 VITALS — BP 142/84 | HR 59 | Temp 97.8°F | Resp 18 | Ht 74.5 in | Wt 263.0 lb

## 2015-07-16 DIAGNOSIS — M25562 Pain in left knee: Secondary | ICD-10-CM | POA: Diagnosis not present

## 2015-07-16 DIAGNOSIS — M25462 Effusion, left knee: Secondary | ICD-10-CM

## 2015-07-16 DIAGNOSIS — K5190 Ulcerative colitis, unspecified, without complications: Secondary | ICD-10-CM | POA: Diagnosis not present

## 2015-07-16 DIAGNOSIS — M1000 Idiopathic gout, unspecified site: Secondary | ICD-10-CM

## 2015-07-16 MED ORDER — PREDNISONE 20 MG PO TABS
ORAL_TABLET | ORAL | Status: DC
Start: 2015-07-16 — End: 2017-03-22

## 2015-07-16 MED ORDER — ALLOPURINOL 300 MG PO TABS
300.0000 mg | ORAL_TABLET | Freq: Every day | ORAL | Status: DC
Start: 2015-07-16 — End: 2016-08-23

## 2015-07-16 NOTE — Patient Instructions (Signed)

## 2015-07-16 NOTE — Progress Notes (Addendum)
Subjective:  This chart was scribed for Robyn Haber MD, by Tamsen Roers, at [facility].  This patient was seen in room 3  and the patient's care was started at 4:32 PM.   Chief Complaint  Patient presents with  . Knee Pain    Left knee/ onset 2 days      Patient ID: Darren [name], male    DOB: November 01, 1960, 54 y.o.   MRN: [id number]  HPI  HPI Comments: Darren [name] is a 54 y.o. male who presents to the [facility] complaining of spontaneous left knee pain onset five days ago.  Patient notes that he feels like it is swollen and doesn't think it is "locking" correctly.  He feels a pressure sensation on his knee.  He states that he went to a foot ball game five nights ago and went to the gym the day after but does not recall any recent injury or sudden movements that may have caused his pain.  Patient has tried using ice to alleviate his symptoms but denies any relief.  He regularly took allopurinol but recently hasn't been taking, instead relying on working out about 4-5 days per week. He has a history of gout in his feet and was diagnosed when he was 21.    Patient has also had a flare of his ulcerative colitis recently. Thinks he is to get back on some an inflammatory for that as well.  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  He thinks he drinks too much, and promises to cut back on his alcoholic intake.  Patient Active Problem List   Diagnosis Date Noted  . Asthma, mild intermittent 11/28/2014  . Varicose veins of both lower extremities 11/28/2014  . Ulcerative colitis ([place]) 01/14/2014  . Gout 01/14/2014  . Erectile dysfunction 01/14/2014   Past Medical History  Diagnosis Date  . Asthma   . Gout   . Ulcerative colitis ([place])     followed by Buccini  . Varicose veins   . Allergy    Past Surgical History  Procedure Laterality Date  . Hernia repair     No Known  Allergies Prior to Admission medications   Medication Sig Start Date End Date Taking? Authorizing Provider  albuterol (PROVENTIL HFA;VENTOLIN HFA) 108 (90 BASE) MCG/ACT inhaler Inhale 2 puffs into the lungs every 6 (six) hours as needed. 11/28/14  Yes Mancel Bale, PA-C  allopurinol (ZYLOPRIM) 300 MG tablet Take 1 tablet (300 mg total) by mouth daily. 11/28/14  Yes Mancel Bale, PA-C  Mesalamine 800 MG TBEC Take 2 tablets daily 01/14/14  Yes Gay Filler Copland, MD  sildenafil (VIAGRA) 100 MG tablet Take 0.5-1 tablets (50-100 mg total) by mouth daily as needed for erectile dysfunction. Patient not taking: Reported on 07/16/2015 11/28/14   Mancel Bale, PA-C   Social History   Social History  . Marital Status: Single    Spouse Name: N/A  . Number of Children: N/A  . Years of Education: N/A   Occupational History  . Not on file.   Social History Main Topics  . Smoking status: Never Smoker   . Smokeless tobacco: Never Used  . Alcohol Use: 0.0 oz/week    0 Standard drinks or equivalent per week     Comment: 10 drinks a week  . Drug Use: No  . Sexual Activity: Yes    Birth Control/ Protection:  Abstinence   Other Topics Concern  . Not on file   Social History Narrative   Married   3 children   Works with [employer]   Exercises 3-4 days a week        Review of Systems     Objective:   Physical Exam  Constitutional: He is oriented to person, place, and time. He appears well-developed and well-nourished. No distress.  HENT:  Head: Normocephalic and atraumatic.  Eyes: Pupils are equal, round, and reactive to light.  Neck: Normal range of motion.  Pulmonary/Chest: No respiratory distress.  Musculoskeletal: Normal range of motion.  Neurological: He is alert and oriented to person, place, and time.  Skin: Skin is warm and dry.  Psychiatric: He has a normal mood and affect. His behavior is normal.  Nursing note and vitals reviewed.  Filed Vitals:   07/16/15 1617   BP: 142/84  Pulse: 59  Temp: 97.8 F (36.6 C)  TempSrc: Oral  Resp: 18  Height: 6' 2.5" (1.892 m)  Weight: 263 lb (119.296 kg)  SpO2: 98%   [facility] reading (PRIMARY) by  Dr. Joseph Art:  Left knee-->negative     Assessment & Plan:    This chart was scribed in my presence and reviewed by me personally.    ICD-9-CM ICD-10-CM   1. Left knee pain 719.46 M25.562 DG Knee Complete 4 Views Left     predniSONE (DELTASONE) 20 MG tablet  2. Knee swelling, left 719.06 M25.462 DG Knee Complete 4 Views Left     predniSONE (DELTASONE) 20 MG tablet  3. Acute idiopathic gout, unspecified site 274.01 M10.00 allopurinol (ZYLOPRIM) 300 MG tablet  4. Ulcerative colitis without complications, unspecified location [facility]) 556.9 K51.90     Signed, Robyn Haber, MD

## 2015-10-25 ENCOUNTER — Ambulatory Visit (INDEPENDENT_AMBULATORY_CARE_PROVIDER_SITE_OTHER): Payer: BLUE CROSS/BLUE SHIELD | Admitting: Emergency Medicine

## 2015-10-25 ENCOUNTER — Ambulatory Visit (INDEPENDENT_AMBULATORY_CARE_PROVIDER_SITE_OTHER): Payer: BLUE CROSS/BLUE SHIELD

## 2015-10-25 VITALS — BP 136/92 | HR 58 | Temp 98.1°F | Resp 16 | Ht 73.0 in | Wt 263.0 lb

## 2015-10-25 DIAGNOSIS — S20229A Contusion of unspecified back wall of thorax, initial encounter: Secondary | ICD-10-CM

## 2015-10-25 LAB — POCT URINALYSIS DIP (MANUAL ENTRY)
Bilirubin, UA: NEGATIVE
Blood, UA: NEGATIVE
Glucose, UA: NEGATIVE
Ketones, POC UA: NEGATIVE
Leukocytes, UA: NEGATIVE
Nitrite, UA: NEGATIVE
Protein Ur, POC: NEGATIVE
Spec Grav, UA: 1.02
Urobilinogen, UA: 0.2
pH, UA: 7

## 2015-10-25 LAB — POC MICROSCOPIC URINALYSIS (UMFC): Mucus: ABSENT

## 2015-10-25 MED ORDER — CYCLOBENZAPRINE HCL 10 MG PO TABS: 10.0000 mg | ORAL_TABLET | Freq: Three times a day (TID) | ORAL | Status: DC | PRN

## 2015-10-25 MED ORDER — HYDROCODONE-ACETAMINOPHEN 5-325 MG PO TABS: 1.0000 | ORAL_TABLET | ORAL | Status: DC | PRN

## 2015-10-25 MED ORDER — NAPROXEN SODIUM 550 MG PO TABS: 550.0000 mg | ORAL_TABLET | Freq: Two times a day (BID) | ORAL | Status: AC

## 2015-10-25 NOTE — Progress Notes (Signed)
Subjective:  Patient ID: Darren [name], male    DOB: 1961-02-12  Age: 55 y.o. MRN: [id number]  CC: Back Pain   HPI Darren [name] presents   Patient was outside and slipped on the ice on Monday and fell on his back. He struck his head had no loss consciousness or neurologic symptoms he's had intermittent headaches since then. Complaint today is pain in his low back. He has no numbness tingling or weakness. No radiation of pain. His pain in the back is worse when he bends over rotates. He denies any other complaint. He has no incontinence of urine or stool. No nausea or vomiting or stool change. He has no dysuria urgency or frequency. He was concerned he might have a kidney stone but that is not consistent with his history. Is traveling out of the [place] on business leaving [place].  History Darren [name] has a past medical history of Asthma; Gout; Ulcerative colitis ([place]); Varicose veins; and Allergy.   He has past surgical history that includes Hernia repair.   His  family history includes Cancer in his father.  He   reports that he has never smoked. He has never used smokeless tobacco. He reports that he drinks alcohol. He reports that he does not use illicit drugs.  Outpatient Prescriptions Prior to Visit  Medication Sig Dispense Refill  . albuterol (PROVENTIL HFA;VENTOLIN HFA) 108 (90 BASE) MCG/ACT inhaler Inhale 2 puffs into the lungs every 6 (six) hours as needed. 1 Inhaler 2  . allopurinol (ZYLOPRIM) 300 MG tablet Take 1 tablet (300 mg total) by mouth daily. 90 tablet 3  . Mesalamine 800 MG TBEC Take 2 tablets daily    . predniSONE (DELTASONE) 20 MG tablet Two daily with food (Patient not taking: Reported on 10/25/2015) 10 tablet 0  . sildenafil (VIAGRA) 100 MG tablet Take 0.5-1 tablets (50-100 mg total) by mouth daily as needed for erectile dysfunction. (Patient not taking: Reported on 07/16/2015) 5 tablet 11   No facility-administered medications prior to  visit.    Social History   Social History  . Marital Status: Single    Spouse Name: N/A  . Number of Children: N/A  . Years of Education: N/A   Social History Main Topics  . Smoking status: Never Smoker   . Smokeless tobacco: Never Used  . Alcohol Use: 0.0 oz/week    0 Standard drinks or equivalent per week     Comment: 10 drinks a week  . Drug Use: No  . Sexual Activity: Yes    Birth Control/ Protection: Abstinence   Other Topics Concern  . None   Social History Narrative   Married   3 children   Works with [employer]   Exercises 3-4 days a week     Review of Systems  Constitutional: Negative for fever, chills and appetite change.  HENT: Negative for congestion, ear pain, postnasal drip, sinus pressure and sore throat.   Eyes: Negative for pain and redness.  Respiratory: Negative for cough, shortness of breath and wheezing.   Cardiovascular: Negative for leg swelling.  Gastrointestinal: Negative for nausea, vomiting, abdominal pain, diarrhea, constipation and blood in stool.  Endocrine: Negative for polyuria.  Genitourinary: Negative for dysuria, urgency, frequency and flank pain.  Musculoskeletal: Positive for back pain. Negative for gait problem.  Skin: Negative for rash.  Neurological: Negative for weakness and headaches.  Psychiatric/Behavioral: Negative for confusion and decreased concentration. The patient is not nervous/anxious.     Objective:  BP 136/92 mmHg  Pulse 58  Temp(Src) 98.1 F (36.7 C) (Oral)  Resp 16  Ht 6' 1"  (1.854 m)  Wt 263 lb (119.296 kg)  BMI 34.71 kg/m2  SpO2 98%  Physical Exam  Constitutional: He is oriented to person, place, and time. He appears well-developed and well-nourished.  HENT:  Head: Normocephalic and atraumatic.  Eyes: Conjunctivae are normal. Pupils are equal, round, and reactive to light.  Pulmonary/Chest: Effort normal.  Musculoskeletal: He exhibits no edema.       Lumbar back: He exhibits tenderness.    Neurological: He is alert and oriented to person, place, and time.  Skin: Skin is dry.  Psychiatric: He has a normal mood and affect. His behavior is normal. Thought content normal.      Assessment & Plan:   Darren [name] was seen today for back pain.  Diagnoses and all orders for this visit:  Contusion, back, unspecified laterality, initial encounter -     DG Lumbar Spine Complete; Future -     POCT urinalysis dipstick -     POCT Microscopic Urinalysis ([facility])  Other orders -     naproxen sodium (ANAPROX DS) 550 MG tablet; Take 1 tablet (550 mg total) by mouth 2 (two) times daily with a meal. -     cyclobenzaprine (FLEXERIL) 10 MG tablet; Take 1 tablet (10 mg total) by mouth 3 (three) times daily as needed for muscle spasms. -     HYDROcodone-acetaminophen (NORCO) 5-325 MG tablet; Take 1-2 tablets by mouth every 4 (four) hours as needed.  I am having Darren [name] start on naproxen sodium, cyclobenzaprine, and HYDROcodone-acetaminophen. I am also having him maintain his Mesalamine, albuterol, sildenafil, predniSONE, and allopurinol.  Meds ordered this encounter  Medications  . naproxen sodium (ANAPROX DS) 550 MG tablet    Sig: Take 1 tablet (550 mg total) by mouth 2 (two) times daily with a meal.    Dispense:  40 tablet    Refill:  0  . cyclobenzaprine (FLEXERIL) 10 MG tablet    Sig: Take 1 tablet (10 mg total) by mouth 3 (three) times daily as needed for muscle spasms.    Dispense:  30 tablet    Refill:  0  . HYDROcodone-acetaminophen (NORCO) 5-325 MG tablet    Sig: Take 1-2 tablets by mouth every 4 (four) hours as needed.    Dispense:  30 tablet    Refill:  0    Appropriate red flag conditions were discussed with the patient as well as actions that should be taken.  Patient expressed his understanding.  Follow-up: Return if symptoms worsen or fail to improve.  Roselee Culver, MD   Results for orders placed or performed in visit on 10/25/15  POCT urinalysis dipstick   Result Value Ref Range   Color, UA yellow yellow   Clarity, UA clear clear   Glucose, UA negative negative   Bilirubin, UA negative negative   Ketones, POC UA negative negative   Spec Grav, UA 1.020    Blood, UA negative negative   pH, UA 7.0    Protein Ur, POC negative negative   Urobilinogen, UA 0.2    Nitrite, UA Negative Negative   Leukocytes, UA Negative Negative  POCT Microscopic Urinalysis ([facility])  Result Value Ref Range   WBC,UR,HPF,POC None None WBC/hpf   RBC,UR,HPF,POC None None RBC/hpf   Bacteria None None, Too numerous to count   Mucus Absent Absent   Epithelial Cells, UR Per Microscopy Few (A) None, Too  numerous to count cells/hpf      [facility] reading (PRIMARY) by  Dr. Ouida Sills.  DJD.

## 2015-10-25 NOTE — Patient Instructions (Signed)
Lumbosacral Strain Lumbosacral strain is a strain of any of the parts that make up your lumbosacral vertebrae. Your lumbosacral vertebrae are the bones that make up the lower third of your backbone. Your lumbosacral vertebrae are held together by muscles and tough, fibrous tissue (ligaments).  CAUSES  A sudden blow to your back can cause lumbosacral strain. Also, anything that causes an excessive stretch of the muscles in the low back can cause this strain. This is typically seen when people exert themselves strenuously, fall, lift heavy objects, bend, or crouch repeatedly. RISK FACTORS  Physically demanding work.  Participation in pushing or pulling sports or sports that require a sudden twist of the back (tennis, golf, baseball).  Weight lifting.  Excessive lower back curvature.  Forward-tilted pelvis.  Weak back or abdominal muscles or both.  Tight hamstrings. SIGNS AND SYMPTOMS  Lumbosacral strain may cause pain in the area of your injury or pain that moves (radiates) down your leg.  DIAGNOSIS Your health care provider can often diagnose lumbosacral strain through a physical exam. In some cases, you may need tests such as X-ray exams.  TREATMENT  Treatment for your lower back injury depends on many factors that your clinician will have to evaluate. However, most treatment will include the use of anti-inflammatory medicines. HOME CARE INSTRUCTIONS   Avoid hard physical activities (tennis, racquetball, waterskiing) if you are not in proper physical condition for it. This may aggravate or create problems.  If you have a back problem, avoid sports requiring sudden body movements. Swimming and walking are generally safer activities.  Maintain good posture.  Maintain a healthy weight.  For acute conditions, you may put ice on the injured area.  Put ice in a plastic bag.  Place a towel between your skin and the bag.  Leave the ice on for 20 minutes, 2-3 times a day.  When the  low back starts healing, stretching and strengthening exercises may be recommended. SEEK MEDICAL CARE IF:  Your back pain is getting worse.  You experience severe back pain not relieved with medicines. SEEK IMMEDIATE MEDICAL CARE IF:   You have numbness, tingling, weakness, or problems with the use of your arms or legs.  There is a change in bowel or bladder control.  You have increasing pain in any area of the body, including your belly (abdomen).  You notice shortness of breath, dizziness, or feel faint.  You feel sick to your stomach (nauseous), are throwing up (vomiting), or become sweaty.  You notice discoloration of your toes or legs, or your feet get very cold. MAKE SURE YOU:   Understand these instructions.  Will watch your condition.  Will get help right away if you are not doing well or get worse.   This information is not intended to replace advice given to you by your health care provider. Make sure you discuss any questions you have with your health care provider.   Document Released: 07/07/2005 Document Revised: 10/18/2014 Document Reviewed: 05/16/2013 Elsevier Interactive Patient Education [employer].

## 2016-04-23 DIAGNOSIS — K5150 Left sided colitis without complications: Secondary | ICD-10-CM | POA: Diagnosis not present

## 2016-06-07 ENCOUNTER — Other Ambulatory Visit: Payer: Self-pay | Admitting: Gastroenterology

## 2016-06-07 DIAGNOSIS — K5190 Ulcerative colitis, unspecified, without complications: Secondary | ICD-10-CM | POA: Diagnosis not present

## 2016-06-07 DIAGNOSIS — K5150 Left sided colitis without complications: Secondary | ICD-10-CM | POA: Diagnosis not present

## 2016-06-07 DIAGNOSIS — K5100 Ulcerative (chronic) pancolitis without complications: Secondary | ICD-10-CM | POA: Diagnosis not present

## 2016-07-02 DIAGNOSIS — K5100 Ulcerative (chronic) pancolitis without complications: Secondary | ICD-10-CM | POA: Diagnosis not present

## 2016-08-21 ENCOUNTER — Other Ambulatory Visit: Payer: Self-pay | Admitting: Family Medicine

## 2016-08-21 DIAGNOSIS — M1000 Idiopathic gout, unspecified site: Secondary | ICD-10-CM

## 2016-08-23 ENCOUNTER — Other Ambulatory Visit: Payer: Self-pay | Admitting: Family Medicine

## 2016-08-23 DIAGNOSIS — M1000 Idiopathic gout, unspecified site: Secondary | ICD-10-CM

## 2016-10-01 ENCOUNTER — Other Ambulatory Visit: Payer: Self-pay | Admitting: Urgent Care

## 2016-10-01 DIAGNOSIS — M1000 Idiopathic gout, unspecified site: Secondary | ICD-10-CM

## 2016-10-28 DIAGNOSIS — J018 Other acute sinusitis: Secondary | ICD-10-CM | POA: Diagnosis not present

## 2016-11-13 ENCOUNTER — Other Ambulatory Visit: Payer: Self-pay | Admitting: Urgent Care

## 2016-11-13 DIAGNOSIS — M1000 Idiopathic gout, unspecified site: Secondary | ICD-10-CM

## 2017-03-22 ENCOUNTER — Encounter: Payer: Self-pay | Admitting: Physician Assistant

## 2017-03-22 ENCOUNTER — Ambulatory Visit (INDEPENDENT_AMBULATORY_CARE_PROVIDER_SITE_OTHER): Payer: BLUE CROSS/BLUE SHIELD

## 2017-03-22 ENCOUNTER — Ambulatory Visit (INDEPENDENT_AMBULATORY_CARE_PROVIDER_SITE_OTHER): Payer: BLUE CROSS/BLUE SHIELD | Admitting: Physician Assistant

## 2017-03-22 VITALS — BP 144/90 | HR 59 | Temp 98.6°F | Resp 16 | Ht 72.0 in | Wt 262.2 lb

## 2017-03-22 DIAGNOSIS — M1000 Idiopathic gout, unspecified site: Secondary | ICD-10-CM

## 2017-03-22 DIAGNOSIS — R0789 Other chest pain: Secondary | ICD-10-CM

## 2017-03-22 DIAGNOSIS — R079 Chest pain, unspecified: Secondary | ICD-10-CM

## 2017-03-22 DIAGNOSIS — R1013 Epigastric pain: Secondary | ICD-10-CM

## 2017-03-22 DIAGNOSIS — Z79899 Other long term (current) drug therapy: Secondary | ICD-10-CM | POA: Diagnosis not present

## 2017-03-22 DIAGNOSIS — M546 Pain in thoracic spine: Secondary | ICD-10-CM

## 2017-03-22 DIAGNOSIS — J4520 Mild intermittent asthma, uncomplicated: Secondary | ICD-10-CM

## 2017-03-22 LAB — POCT CBC
Granulocyte percent: 76.7 %G (ref 37–80)
HCT, POC: 42.5 % — AB (ref 43.5–53.7)
Hemoglobin: 16 g/dL (ref 14.1–18.1)
Lymph, poc: 1.2 (ref 0.6–3.4)
MCH, POC: 32.9 pg — AB (ref 27–31.2)
MCHC: 37.6 g/dL — AB (ref 31.8–35.4)
MCV: 87.3 fL (ref 80–97)
MID (cbc): 0.3 (ref 0–0.9)
MPV: 8.2 fL (ref 0–99.8)
POC Granulocyte: 5.2 (ref 2–6.9)
POC LYMPH PERCENT: 18.3 %L (ref 10–50)
POC MID %: 5 %M (ref 0–12)
Platelet Count, POC: 149 10*3/uL (ref 142–424)
RBC: 4.86 M/uL (ref 4.69–6.13)
RDW, POC: 13.7 %
WBC: 6.8 10*3/uL (ref 4.6–10.2)

## 2017-03-22 MED ORDER — CYCLOBENZAPRINE HCL 10 MG PO TABS: 10.0000 mg | ORAL_TABLET | Freq: Three times a day (TID) | ORAL | 0 refills | Status: DC | PRN

## 2017-03-22 MED ORDER — ALLOPURINOL 300 MG PO TABS: 300.0000 mg | ORAL_TABLET | Freq: Every day | ORAL | 4 refills | Status: DC

## 2017-03-22 MED ORDER — ALBUTEROL SULFATE HFA 108 (90 BASE) MCG/ACT IN AERS: 2.0000 | INHALATION_SPRAY | Freq: Four times a day (QID) | RESPIRATORY_TRACT | 2 refills | Status: AC | PRN

## 2017-03-22 NOTE — Patient Instructions (Addendum)
Your EKG is not concerning for acute changes.  Your chest x-ray is negative for cardiac problems. It does show some degenerative changes of your spine. This could possibly be causing discogenic "bandlike" radiating pain.  I am referring you to cardiology for a more complete work-up to be safe. They will call you to schedule an appointment.    Thank you for coming in today. I hope you feel we met your needs.  Feel free to call [facility] if you have any questions or further requests.  Please consider signing up for MyChart if you do not already have it, as this is a great way to communicate with me.  Best,  Whitney McVey, PA-C  IF you received an x-ray today, you will receive an invoice from [facility]. Please contact [facility] at [phone] with questions or concerns regarding your invoice.   IF you received labwork today, you will receive an invoice from [facility]. Please contact [facility] at [phone] with questions or concerns regarding your invoice.   Our billing staff will not be able to assist you with questions regarding bills from these companies.  You will be contacted with the lab results as soon as they are available. The fastest way to get your results is to activate your My Chart account. Instructions are located on the last page of this paperwork. If you have not heard from [place] regarding the results in 2 weeks, please contact this office.

## 2017-03-22 NOTE — Progress Notes (Signed)
 Darren [name]  MRN: [id number] DOB: 05/04/1961  PCP: Patient, No Pcp Per  Subjective:  Pt is a 56 year old male PMC ulcerative colitis, gout and asthma who presents to clinic for abdominal pain x 2-3 weeks. Pain is in his epigastric area on right and left side and is described as "dull and sore".  Is present most of the day. 4/10. Radiates to his mid back. Is not worse with/without food. Hurts worse with movement.  Denies n/v, fever, chills, tearing pain, regurgitation, bloating.  He goes to the gym, does crunches. He stopped working out x 1 week to see if pain would go away.   Review of Systems  Constitutional: Negative for chills, diaphoresis, fatigue and fever.  Respiratory: Negative for cough, chest tightness, shortness of breath and wheezing.   Cardiovascular: Positive for chest pain. Negative for palpitations and leg swelling.  Gastrointestinal: Positive for abdominal pain. Negative for constipation, diarrhea, nausea and vomiting.  Musculoskeletal: Positive for back pain. Negative for neck pain.  Neurological: Negative for dizziness, syncope, light-headedness and headaches.  Psychiatric/Behavioral: Negative for sleep disturbance. The patient is not nervous/anxious.     Patient Active Problem List   Diagnosis Date Noted  . Asthma, mild intermittent 11/28/2014  . Varicose veins of both lower extremities 11/28/2014  . Ulcerative colitis ([facility]) 01/14/2014  . Gout 01/14/2014  . Erectile dysfunction 01/14/2014    Current Outpatient Prescriptions on File Prior to Visit  Medication Sig Dispense Refill  . Mesalamine 800 MG TBEC Take 2 tablets daily    . albuterol (PROVENTIL HFA;VENTOLIN HFA) 108 (90 BASE) MCG/ACT inhaler Inhale 2 puffs into the lungs every 6 (six) hours as needed. (Patient not taking: Reported on 03/22/2017) 1 Inhaler 2  . allopurinol (ZYLOPRIM) 300 MG tablet TAKE 1 TABLET BY MOUTH DAILY (Patient not taking: Reported on 03/22/2017) 30 tablet 0  . cyclobenzaprine  (FLEXERIL) 10 MG tablet Take 1 tablet (10 mg total) by mouth 3 (three) times daily as needed for muscle spasms. (Patient not taking: Reported on 03/22/2017) 30 tablet 0  . HYDROcodone-acetaminophen (NORCO) 5-325 MG tablet Take 1-2 tablets by mouth every 4 (four) hours as needed. (Patient not taking: Reported on 03/22/2017) 30 tablet 0  . sildenafil (VIAGRA) 100 MG tablet Take 0.5-1 tablets (50-100 mg total) by mouth daily as needed for erectile dysfunction. (Patient not taking: Reported on 07/16/2015) 5 tablet 11   No current facility-administered medications on file prior to visit.     No Known Allergies   Objective:  BP (!) 144/90   Pulse (!) 59   Temp 98.6 F (37 C) (Oral)   Resp 16   Ht 6' (1.829 m)   Wt 262 lb 3.2 oz (118.9 kg)   SpO2 97%   BMI 35.56 kg/m   Physical Exam  Constitutional: He is oriented to person, place, and time and well-developed, well-nourished, and in no distress. No distress.  Cardiovascular: Normal rate, regular rhythm and normal heart sounds.   Pulmonary/Chest: Effort normal and breath sounds normal. No respiratory distress.  Abdominal: Soft. Bowel sounds are normal. There is no tenderness.  Musculoskeletal:       Thoracic back: He exhibits normal range of motion, no tenderness, no bony tenderness and no spasm.  Neurological: He is alert and oriented to person, place, and time. GCS score is 15.  Skin: Skin is warm and dry.  Psychiatric: Mood, memory, affect and judgment normal.  Vitals reviewed.   Dg Chest 2 View  Result Date: 03/22/2017 CLINICAL   DATA:  Chest pain radiating to the back EXAM: CHEST  2 VIEW COMPARISON:  06/11/2014 FINDINGS: Mildly low lung volumes with streaky bibasilar atelectasis. No focal consolidation or pleural effusion. Heart size upper normal. No pneumothorax. Degenerative changes of the spine. IMPRESSION: Low lung volumes with streaky bibasilar atelectasis. Borderline to mild cardiomegaly without edema. Electronically Signed   By:  Kim  Fujinaga M.D.   On: 03/22/2017 18:46   Results for orders placed or performed in visit on 03/22/17  POCT CBC  Result Value Ref Range   WBC 6.8 4.6 - 10.2 K/uL   Lymph, poc 1.2 0.6 - 3.4   POC LYMPH PERCENT 18.3 10 - 50 %L   MID (cbc) 0.3 0 - 0.9   POC MID % 5.0 0 - 12 %M   POC Granulocyte 5.2 2 - 6.9   Granulocyte percent 76.7 37 - 80 %G   RBC 4.86 4.69 - 6.13 M/uL   Hemoglobin 16.0 14.1 - 18.1 g/dL   HCT, POC 42.5 (A) 43.5 - 53.7 %   MCV 87.3 80 - 97 fL   MCH, POC 32.9 (A) 27 - 31.2 pg   MCHC 37.6 (A) 31.8 - 35.4 g/dL   RDW, POC 13.7 %   Platelet Count, POC 149 142 - 424 K/uL   MPV 8.2 0 - 99.8 fL   Lab Results  Component Value Date   CHOL 141 11/28/2014   HDL 37 (L) 11/28/2014   LDLCALC 84 11/28/2014   TRIG 98 11/28/2014   CHOLHDL 3.8 11/28/2014    EKG shows nonspecific QRS widening. Sinus bradycardia.  Assessment and Plan :  This case was discussed with Dr. Greene.  1. Atypical chest pain 2. Midline thoracic back pain, unspecified chronicity 3. Epigastric pain - EKG 12-Lead - CMP14+EGFR - Amylase - Lipase - POCT CBC - Lipid panel - DG Chest 2 View; Future - Ambulatory referral to Cardiology - cyclobenzaprine (FLEXERIL) 10 MG tablet; Take 1 tablet (10 mg total) by mouth 3 (three) times daily as needed for muscle spasms.  Dispense: 30 tablet; Refill: 0 - pt presents with 2-3 weeks of dull atypical chest, abdominal and back pain. Suspect possible discogenic vs pancreatitis vs musculoskeletal. Physical exam is not impressive. EKG is not concerning for ischemia. Chest x-ray shows mild cardiomegaly. Will refer to cardiology for work-up. Labs are pending, will contact with results.   4. Encounter for medication management 5. Acute idiopathic gout, unspecified site - allopurinol (ZYLOPRIM) 300 MG tablet; Take 1 tablet (300 mg total) by mouth daily.  Dispense: 30 tablet; Refill: 4  6. Mild intermittent asthma without complication - albuterol (PROVENTIL HFA;VENTOLIN  HFA) 108 (90 Base) MCG/ACT inhaler; Inhale 2 puffs into the lungs every 6 (six) hours as needed.  Dispense: 1 Inhaler; Refill: 2   Whitney , PA-C  [facility] 03/22/2017 5:42 PM  

## 2017-03-23 LAB — LIPID PANEL
Chol/HDL Ratio: 3.1 ratio (ref 0.0–5.0)
Cholesterol, Total: 138 mg/dL (ref 100–199)
HDL: 44 mg/dL (ref >39)
LDL Calculated: 72 mg/dL (ref 0–99)
Triglycerides: 109 mg/dL (ref 0–149)
VLDL Cholesterol Cal: 22 mg/dL (ref 5–40)

## 2017-03-23 LAB — CMP14+EGFR
ALT: 53 IU/L — ABNORMAL HIGH (ref 0–44)
AST: 58 IU/L — ABNORMAL HIGH (ref 0–40)
Albumin/Globulin Ratio: 2.5 — ABNORMAL HIGH (ref 1.2–2.2)
Albumin: 4.8 g/dL (ref 3.5–5.5)
Alkaline Phosphatase: 64 IU/L (ref 39–117)
BUN/Creatinine Ratio: 16 (ref 9–20)
BUN: 16 mg/dL (ref 6–24)
Bilirubin Total: 0.6 mg/dL (ref 0.0–1.2)
CO2: 28 mmol/L (ref 20–29)
Calcium: 9.8 mg/dL (ref 8.7–10.2)
Chloride: 99 mmol/L (ref 96–106)
Creatinine, Ser: 1.03 mg/dL (ref 0.76–1.27)
GFR calc Af Amer: 93 mL/min/{1.73_m2} (ref >59)
GFR calc non Af Amer: 80 mL/min/{1.73_m2} (ref >59)
Globulin, Total: 1.9 g/dL (ref 1.5–4.5)
Glucose: 131 mg/dL — ABNORMAL HIGH (ref 65–99)
Potassium: 3.8 mmol/L (ref 3.5–5.2)
Sodium: 143 mmol/L (ref 134–144)
Total Protein: 6.7 g/dL (ref 6.0–8.5)

## 2017-03-23 LAB — LIPASE: Lipase: 37 U/L (ref 13–78)

## 2017-03-23 LAB — AMYLASE: Amylase: 28 U/L — ABNORMAL LOW (ref 31–124)

## 2017-03-28 ENCOUNTER — Encounter: Payer: Self-pay | Admitting: Physician Assistant

## 2017-03-28 NOTE — Progress Notes (Signed)
Labs are negative. Recommend back and core strengthening and stretching exercises. He will call if he desires physical therapy.

## 2017-05-30 IMAGING — CR DG LUMBAR SPINE COMPLETE 4+V
7 series · 7 of 7 positions shown · non-contrast
Comparison: None.

CLINICAL DATA: Fell on ice 6 days ago. Persistent low back pain.
Initial encounter.

EXAM:
LUMBAR SPINE - COMPLETE 4+ VIEW

[AP]
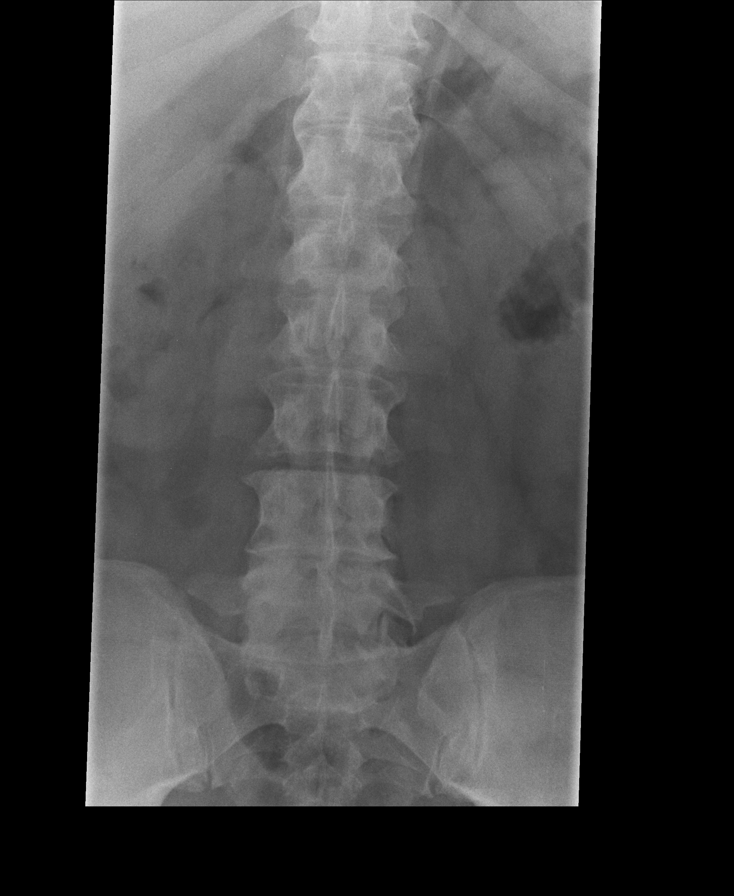

[rpo (1 of 2)]
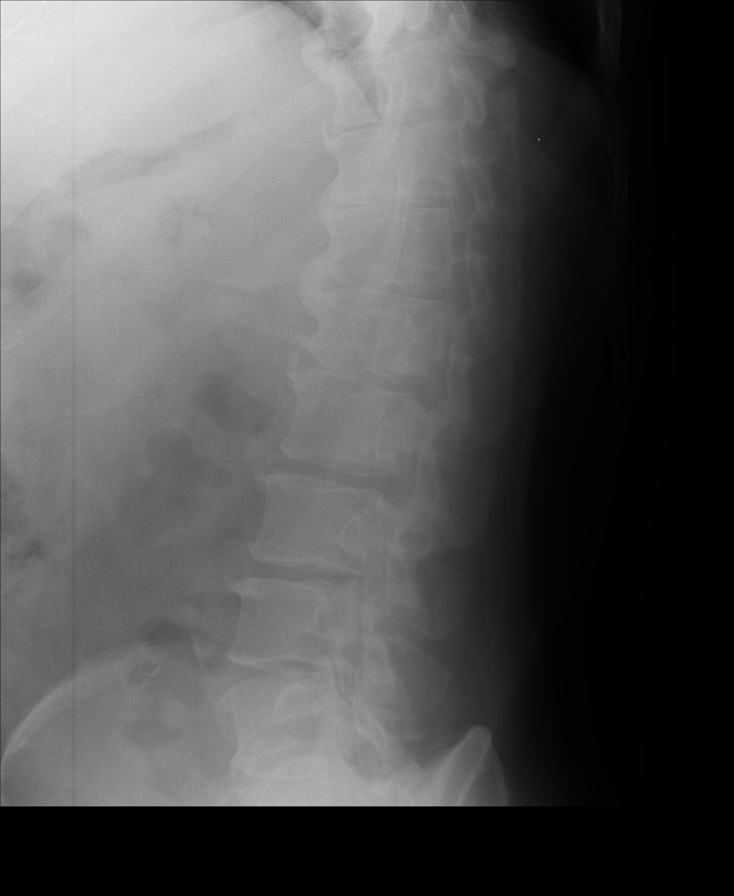

[lpo (1 of 2)]
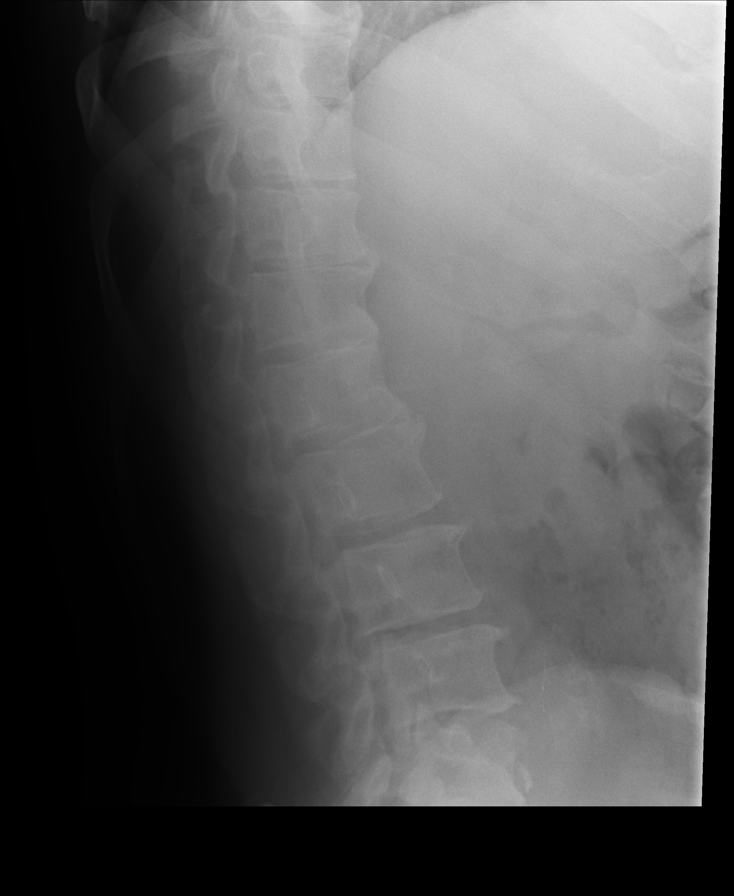

[lateral]
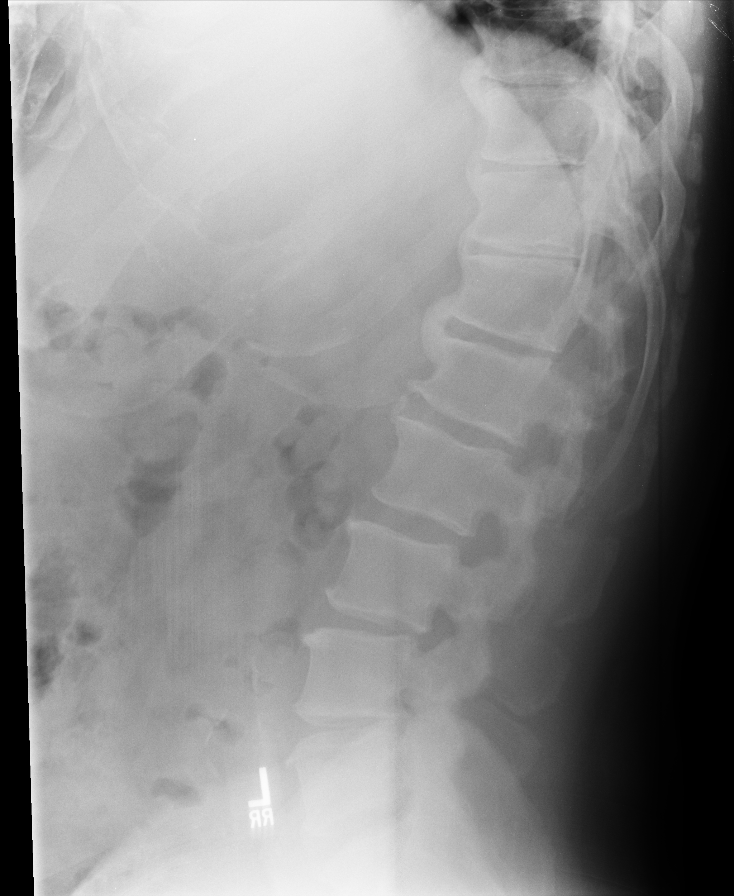

[l5 s1]
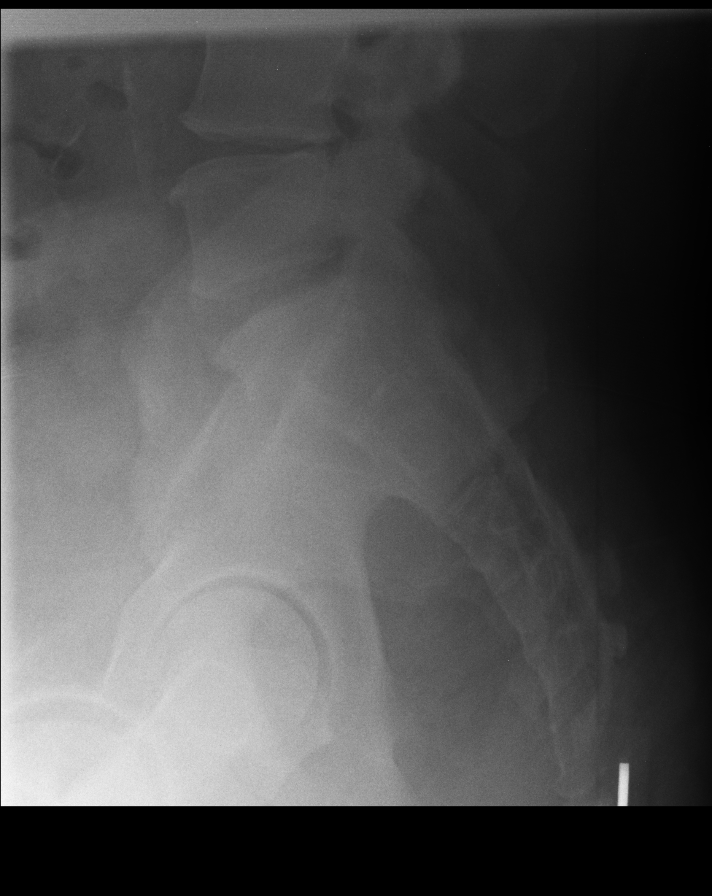

[rpo (2 of 2)]
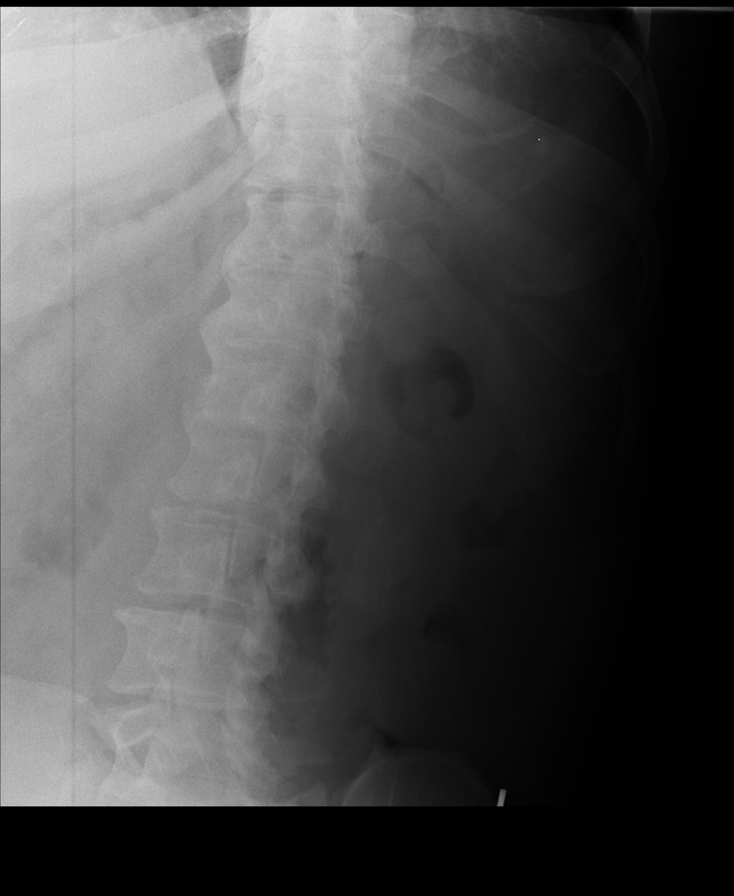

[lpo (2 of 2)]
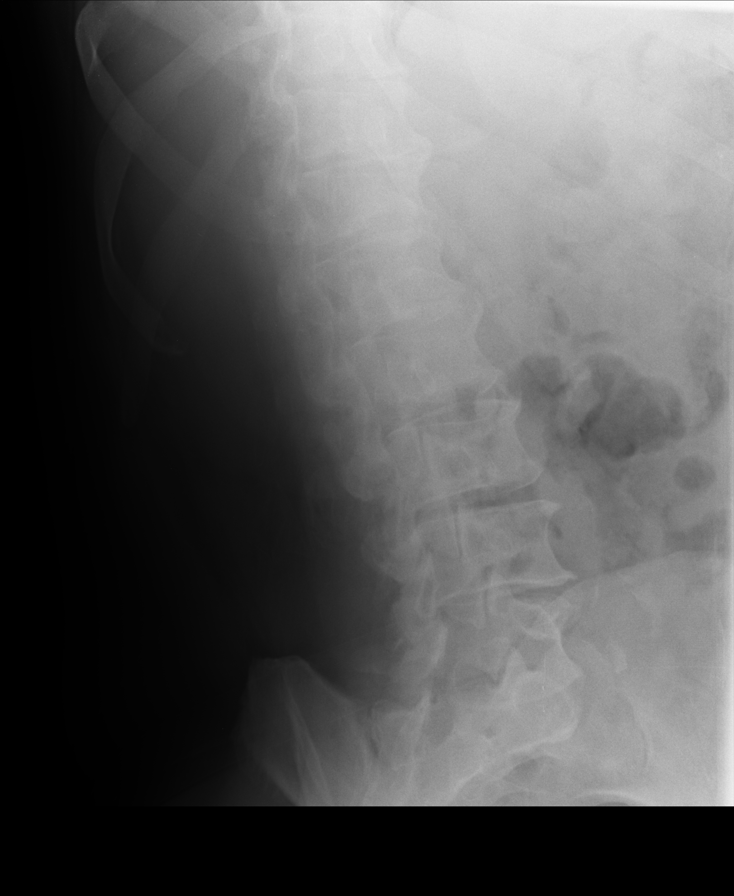

[7 of 7 positions shown; findings below may reference images not displayed]

FINDINGS: There is no evidence of lumbar spine fracture. Alignment is normal.
Six non rib-bearing lumbar vertebra noted. Mild degenerative disc
disease is seen at all lumbar levels. No evidence facet arthropathy
or other significant bone abnormality.
IMPRESSION: No acute findings. Mild degenerative disc disease at all lumbar
levels.

## 2017-09-17 DIAGNOSIS — Z760 Encounter for issue of repeat prescription: Secondary | ICD-10-CM | POA: Diagnosis not present

## 2017-09-17 DIAGNOSIS — S838X1A Sprain of other specified parts of right knee, initial encounter: Secondary | ICD-10-CM | POA: Diagnosis not present

## 2017-11-06 ENCOUNTER — Other Ambulatory Visit: Payer: Self-pay | Admitting: Physician Assistant

## 2017-11-06 DIAGNOSIS — M1000 Idiopathic gout, unspecified site: Secondary | ICD-10-CM

## 2017-12-06 DIAGNOSIS — R030 Elevated blood-pressure reading, without diagnosis of hypertension: Secondary | ICD-10-CM | POA: Diagnosis not present

## 2017-12-06 DIAGNOSIS — R062 Wheezing: Secondary | ICD-10-CM | POA: Diagnosis not present

## 2017-12-06 DIAGNOSIS — Z760 Encounter for issue of repeat prescription: Secondary | ICD-10-CM | POA: Diagnosis not present

## 2017-12-06 DIAGNOSIS — J0100 Acute maxillary sinusitis, unspecified: Secondary | ICD-10-CM | POA: Diagnosis not present

## 2017-12-09 DIAGNOSIS — H00025 Hordeolum internum left lower eyelid: Secondary | ICD-10-CM | POA: Diagnosis not present

## 2018-03-05 ENCOUNTER — Other Ambulatory Visit: Payer: Self-pay | Admitting: Physician Assistant

## 2018-03-05 DIAGNOSIS — M1000 Idiopathic gout, unspecified site: Secondary | ICD-10-CM

## 2018-04-10 ENCOUNTER — Other Ambulatory Visit: Payer: Self-pay | Admitting: Physician Assistant

## 2018-04-10 DIAGNOSIS — M1000 Idiopathic gout, unspecified site: Secondary | ICD-10-CM

## 2018-04-10 NOTE — Telephone Encounter (Signed)
allopurinol refill Last Refill:03/08/18 # 30 No RF Last OV: 03/22/17 PCP: Mercer Pod PA Pharmacy:[facility] [place] pt and left message to make office appt. Will do a courtesy 15 day refill.

## 2018-06-07 ENCOUNTER — Encounter: Payer: Self-pay | Admitting: Emergency Medicine

## 2018-06-07 ENCOUNTER — Other Ambulatory Visit: Payer: Self-pay

## 2018-06-07 ENCOUNTER — Ambulatory Visit: Payer: BLUE CROSS/BLUE SHIELD | Admitting: Emergency Medicine

## 2018-06-07 VITALS — BP 149/91 | HR 53 | Temp 98.4°F | Resp 16 | Ht 72.0 in | Wt 267.8 lb

## 2018-06-07 DIAGNOSIS — K5190 Ulcerative colitis, unspecified, without complications: Secondary | ICD-10-CM

## 2018-06-07 DIAGNOSIS — M1000 Idiopathic gout, unspecified site: Secondary | ICD-10-CM

## 2018-06-07 MED ORDER — ALLOPURINOL 300 MG PO TABS: 300.0000 mg | ORAL_TABLET | Freq: Every day | ORAL | 3 refills | Status: AC

## 2018-06-07 MED ORDER — MESALAMINE 1.2 G PO TBEC: 4.8000 g | DELAYED_RELEASE_TABLET | Freq: Every day | ORAL | 3 refills | Status: DC

## 2018-06-07 NOTE — Progress Notes (Signed)
Darren [name] 57 y.o.   Chief Complaint  Patient presents with  . Medication Refill    allopurinol and mesalamine    HISTORY OF PRESENT ILLNESS: This is a 57 y.o. male with history of gout and ulcerative colitis.  Needs medication refills.  Has no complaints or medical concerns.  HPI   Prior to Admission medications   Medication Sig Start Date End Date Taking? Authorizing Provider  allopurinol (ZYLOPRIM) 300 MG tablet Take 1 tablet (300 mg total) by mouth daily. 06/07/18 09/05/18 Yes Horald Pollen, MD  Mesalamine 800 MG TBEC Take 2 tablets daily 01/14/14  Yes Copland, Gay Filler, MD  albuterol (PROVENTIL HFA;VENTOLIN HFA) 108 (90 Base) MCG/ACT inhaler Inhale 2 puffs into the lungs every 6 (six) hours as needed. Patient not taking: Reported on 06/07/2018 03/22/17   McVey, Gelene Mink, PA-C  cyclobenzaprine (FLEXERIL) 10 MG tablet Take 1 tablet (10 mg total) by mouth 3 (three) times daily as needed for muscle spasms. Patient not taking: Reported on 06/07/2018 03/22/17   McVey, Gelene Mink, PA-C  mesalamine (LIALDA) 1.2 g EC tablet Take 4 tablets (4.8 g total) by mouth daily with breakfast. 06/07/18 09/05/18  Horald Pollen, MD    No Known Allergies  Patient Active Problem List   Diagnosis Date Noted  . Asthma, mild intermittent 11/28/2014  . Varicose veins of both lower extremities 11/28/2014  . Ulcerative colitis ([place]) 01/14/2014  . Gout 01/14/2014  . Erectile dysfunction 01/14/2014    Past Medical History:  Diagnosis Date  . Allergy   . Asthma   . Gout   . Ulcerative colitis ([place])    followed by Buccini  . Varicose veins     Past Surgical History:  Procedure Laterality Date  . HERNIA REPAIR      Social History   Socioeconomic History  . Marital status: Married    Spouse name: Not on file  . Number of children: Not on file  . Years of education: Not on file  . Highest education level: Not on file  Occupational History  . Not on file   Social Needs  . Financial resource strain: Not on file  . Food insecurity:    Worry: Not on file    Inability: Not on file  . Transportation needs:    Medical: Not on file    Non-medical: Not on file  Tobacco Use  . Smoking status: Never Smoker  . Smokeless tobacco: Never Used  Substance and Sexual Activity  . Alcohol use: Yes    Alcohol/week: 0.0 standard drinks    Comment: 10 drinks a week  . Drug use: No  . Sexual activity: Yes    Birth control/protection: Abstinence  Lifestyle  . Physical activity:    Days per week: Not on file    Minutes per session: Not on file  . Stress: Not on file  Relationships  . Social connections:    Talks on phone: Not on file    Gets together: Not on file    Attends religious service: Not on file    Active member of club or organization: Not on file    Attends meetings of clubs or organizations: Not on file    Relationship status: Not on file  . Intimate partner violence:    Fear of current or ex partner: Not on file    Emotionally abused: Not on file    Physically abused: Not on file    Forced sexual activity: Not on file  Other Topics Concern  . Not on file  Social History Narrative   Married   3 children   Works with [employer]   Exercises 3-4 days a week    Family History  Problem Relation Age of Onset  . Cancer Father        colon cancer     Review of Systems  Constitutional: Negative.  Negative for chills, fever and weight loss.  HENT: Negative.   Respiratory: Negative.  Negative for cough and shortness of breath.   Cardiovascular: Negative.  Negative for chest pain and palpitations.  Gastrointestinal: Negative.  Negative for abdominal pain, diarrhea, nausea and vomiting.  Genitourinary: Negative.  Negative for dysuria and hematuria.  Musculoskeletal: Negative.  Negative for back pain, myalgias and neck pain.  Skin: Negative.  Negative for rash.  Neurological: Negative.  Negative for dizziness and headaches.   Endo/Heme/Allergies: Negative.   All other systems reviewed and are negative.   Vitals:   06/07/18 0851  BP: (!) 149/91  Pulse: (!) 53  Resp: 16  Temp: 98.4 F (36.9 C)  SpO2: 95%    Physical Exam  Constitutional: He is oriented to person, place, and time. He appears [name]-developed and [name]-nourished.  HENT:  Head: Normocephalic and atraumatic.  Eyes: Pupils are equal, round, and reactive to light. Conjunctivae and EOM are normal.  Neck: Normal range of motion. Neck supple.  Cardiovascular: Normal rate and regular rhythm.  Pulmonary/Chest: Effort normal and breath sounds normal.  Abdominal: Soft. There is no tenderness.  Musculoskeletal: Normal range of motion.  Neurological: He is alert and oriented to person, place, and time. No sensory deficit. He exhibits normal muscle tone.  Skin: Skin is warm and dry. Capillary refill takes less than 2 seconds.  Psychiatric: He has a normal mood and affect. His behavior is normal.  Vitals reviewed.  A total of 25 minutes was spent in the room with the patient, greater than 50% of which was in counseling/coordination of care regarding chronic medical problems, medications, and need for follow-up.   ASSESSMENT & PLAN: Bronson was seen today for medication refill.  Diagnoses and all orders for this visit:  Ulcerative colitis without complications, unspecified location ([place]) -     mesalamine (LIALDA) 1.2 g EC tablet; Take 4 tablets (4.8 g total) by mouth daily with breakfast.  Acute idiopathic gout, unspecified site -     allopurinol (ZYLOPRIM) 300 MG tablet; Take 1 tablet (300 mg total) by mouth daily.   Patient Instructions       If you have lab work done today you will be contacted with your lab results within the next 2 weeks.  If you have not heard from [place] then please contact us. The fastest way to get your results is to register for My Chart.   IF you received an x-ray today, you will receive an invoice from [facility]  Radiology. Please contact [facility]iology at [phone] with questions or concerns regarding your invoice.   IF you received labwork today, you will receive an invoice from [place]. Please contact [facility] at [phone] with questions or concerns regarding your invoice.   Our billing staff will not be able to assist you with questions regarding bills from these companies.  You will be contacted with the lab results as soon as they are available. The fastest way to get your results is to activate your My Chart account. Instructions are located on the last page of this paperwork. If you have not heard from  [place] regarding the results in 2 weeks, please contact this office.    Low-Purine Diet Purines are compounds that affect the level of uric acid in your body. A low-purine diet is a diet that is low in purines. Eating a low-purine diet can prevent the level of uric acid in your body from getting too high and causing gout or kidney stones or both. What do I need to know about this diet?  Choose low-purine foods. Examples of low-purine foods are listed in the next section.  Drink plenty of fluids, especially water. Fluids can help remove uric acid from your body. Try to drink 8-16 cups (1.9-3.8 L) a day.  Limit foods high in fat, especially saturated fat, as fat makes it harder for the body to get rid of uric acid. Foods high in saturated fat include pizza, cheese, ice cream, whole milk, fried foods, and gravies. Choose foods that are lower in fat and lean sources of protein. Use olive oil when cooking as it contains healthy fats that are not high in saturated fat.  Limit alcohol. Alcohol interferes with the elimination of uric acid from your body. If you are having a gout attack, avoid all alcohol.  Keep in mind that different people's bodies react differently to different foods. You will probably learn over time which foods do or do not affect you. If you discover that a food tends to  cause your gout to flare up, avoid eating that food. You can more freely enjoy foods that do not cause problems. If you have any questions about a food item, talk to your dietitian or health care provider. Which foods are low, moderate, and high in purines? The following is a list of foods that are low, moderate, and high in purines. You can eat any amount of the foods that are low in purines. You may be able to have small amounts of foods that are moderate in purines. Ask your health care provider how much of a food moderate in purines you can have. Avoid foods high in purines. Grains  Foods low in purines: Enriched white bread, pasta, rice, cake, cornbread, popcorn.  Foods moderate in purines: Whole-grain breads and cereals, wheat germ, bran, oatmeal. Uncooked oatmeal. Dry wheat bran or wheat germ.  Foods high in purines: Pancakes, [place] toast, biscuits, muffins. Vegetables  Foods low in purines: All vegetables, except those that are moderate in purines.  Foods moderate in purines: Asparagus, cauliflower, spinach, mushrooms, green peas. Fruits  All fruits are low in purines. Meats and other Protein Foods  Foods low in purines: Eggs, nuts, peanut butter.  Foods moderate in purines: 80-90% lean beef, lamb, veal, pork, poultry, fish, eggs, peanut butter, nuts. Crab, lobster, oysters, and shrimp. Cooked dried beans, peas, and lentils.  Foods high in purines: Anchovies, sardines, herring, mussels, tuna, codfish, scallops, trout, and haddock. Berniece Salines. Organ meats (such as liver or kidney). Tripe. Game meat. Goose. Sweetbreads. Dairy  All dairy foods are low in purines. Low-fat and fat-free dairy products are best because they are low in saturated fat. Beverages  Drinks low in purines: Water, carbonated beverages, tea, coffee, cocoa.  Drinks moderate in purines: Soft drinks and other drinks sweetened with high-fructose corn syrup. Juices. To find whether a food or drink is sweetened with  high-fructose corn syrup, look at the ingredients list.  Drinks high in purines: Alcoholic beverages (such as beer). Condiments  Foods low in purines: Salt, herbs, olives, pickles, relishes, vinegar.  Foods moderate in purines: Butter,  margarine, oils, mayonnaise. Fats and Oils  Foods low in purines: All types, except gravies and sauces made with meat.  Foods high in purines: Gravies and sauces made with meat. Other Foods  Foods low in purines: Sugars, sweets, gelatin. Cake. Soups made without meat.  Foods moderate in purines: Meat-based or fish-based soups, broths, or bouillons. Foods and drinks sweetened with high-fructose corn syrup.  Foods high in purines: High-fat desserts (such as ice cream, cookies, cakes, pies, doughnuts, and chocolate). Contact your dietitian for more information on foods that are not listed here. This information is not intended to replace advice given to you by your health care provider. Make sure you discuss any questions you have with your health care provider. Document Released: 01/22/2011 Document Revised: 03/04/2016 Document Reviewed: 09/03/2013 Elsevier Interactive Patient Education  2017 Elsevier Inc.      Agustina Caroli, MD [facility]

## 2018-06-07 NOTE — Patient Instructions (Addendum)
If you have lab work done today you will be contacted with your lab results within the next 2 weeks.  If you have not heard from [place] then please contact us. The fastest way to get your results is to register for My Chart.   IF you received an x-ray today, you will receive an invoice from [facility] Radiology. Please contact [facility] Radiology at [phone] with questions or concerns regarding your invoice.   IF you received labwork today, you will receive an invoice from [place]. Please contact [facility] at [phone] with questions or concerns regarding your invoice.   Our billing staff will not be able to assist you with questions regarding bills from these companies.  You will be contacted with the lab results as soon as they are available. The fastest way to get your results is to activate your My Chart account. Instructions are located on the last page of this paperwork. If you have not heard from [place] regarding the results in 2 weeks, please contact this office.    Low-Purine Diet Purines are compounds that affect the level of uric acid in your body. A low-purine diet is a diet that is low in purines. Eating a low-purine diet can prevent the level of uric acid in your body from getting too high and causing gout or kidney stones or both. What do I need to know about this diet?  Choose low-purine foods. Examples of low-purine foods are listed in the next section.  Drink plenty of fluids, especially water. Fluids can help remove uric acid from your body. Try to drink 8-16 cups (1.9-3.8 L) a day.  Limit foods high in fat, especially saturated fat, as fat makes it harder for the body to get rid of uric acid. Foods high in saturated fat include pizza, cheese, ice cream, whole milk, fried foods, and gravies. Choose foods that are lower in fat and lean sources of protein. Use olive oil when cooking as it contains healthy fats that are not high in saturated fat.  Limit alcohol. Alcohol  interferes with the elimination of uric acid from your body. If you are having a gout attack, avoid all alcohol.  Keep in mind that different people's bodies react differently to different foods. You will probably learn over time which foods do or do not affect you. If you discover that a food tends to cause your gout to flare up, avoid eating that food. You can more freely enjoy foods that do not cause problems. If you have any questions about a food item, talk to your dietitian or health care provider. Which foods are low, moderate, and high in purines? The following is a list of foods that are low, moderate, and high in purines. You can eat any amount of the foods that are low in purines. You may be able to have small amounts of foods that are moderate in purines. Ask your health care provider how much of a food moderate in purines you can have. Avoid foods high in purines. Grains  Foods low in purines: Enriched white bread, pasta, rice, cake, cornbread, popcorn.  Foods moderate in purines: Whole-grain breads and cereals, wheat germ, bran, oatmeal. Uncooked oatmeal. Dry wheat bran or wheat germ.  Foods high in purines: Pancakes, [place] toast, biscuits, muffins. Vegetables  Foods low in purines: All vegetables, except those that are moderate in purines.  Foods moderate in purines: Asparagus, cauliflower, spinach, mushrooms, green peas. Fruits  All fruits are low in purines. Meats  and other Protein Foods  Foods low in purines: Eggs, nuts, peanut butter.  Foods moderate in purines: 80-90% lean beef, lamb, veal, pork, poultry, fish, eggs, peanut butter, nuts. Crab, lobster, oysters, and shrimp. Cooked dried beans, peas, and lentils.  Foods high in purines: Anchovies, sardines, herring, mussels, tuna, codfish, scallops, trout, and haddock. Berniece Salines. Organ meats (such as liver or kidney). Tripe. Game meat. Goose. Sweetbreads. Dairy  All dairy foods are low in purines. Low-fat and fat-free  dairy products are best because they are low in saturated fat. Beverages  Drinks low in purines: Water, carbonated beverages, tea, coffee, cocoa.  Drinks moderate in purines: Soft drinks and other drinks sweetened with high-fructose corn syrup. Juices. To find whether a food or drink is sweetened with high-fructose corn syrup, look at the ingredients list.  Drinks high in purines: Alcoholic beverages (such as beer). Condiments  Foods low in purines: Salt, herbs, olives, pickles, relishes, vinegar.  Foods moderate in purines: Butter, margarine, oils, mayonnaise. Fats and Oils  Foods low in purines: All types, except gravies and sauces made with meat.  Foods high in purines: Gravies and sauces made with meat. Other Foods  Foods low in purines: Sugars, sweets, gelatin. Cake. Soups made without meat.  Foods moderate in purines: Meat-based or fish-based soups, broths, or bouillons. Foods and drinks sweetened with high-fructose corn syrup.  Foods high in purines: High-fat desserts (such as ice cream, cookies, cakes, pies, doughnuts, and chocolate). Contact your dietitian for more information on foods that are not listed here. This information is not intended to replace advice given to you by your health care provider. Make sure you discuss any questions you have with your health care provider. Document Released: 01/22/2011 Document Revised: 03/04/2016 Document Reviewed: 09/03/2013 Elsevier Interactive Patient Education  2017 [employer].

## 2018-08-09 DIAGNOSIS — K5190 Ulcerative colitis, unspecified, without complications: Secondary | ICD-10-CM | POA: Diagnosis not present

## 2018-08-09 DIAGNOSIS — I8393 Asymptomatic varicose veins of bilateral lower extremities: Secondary | ICD-10-CM | POA: Diagnosis not present

## 2018-08-09 DIAGNOSIS — M109 Gout, unspecified: Secondary | ICD-10-CM | POA: Diagnosis not present

## 2018-08-09 DIAGNOSIS — I10 Essential (primary) hypertension: Secondary | ICD-10-CM | POA: Diagnosis not present

## 2019-03-29 DIAGNOSIS — L812 Freckles: Secondary | ICD-10-CM | POA: Diagnosis not present

## 2019-03-29 DIAGNOSIS — L821 Other seborrheic keratosis: Secondary | ICD-10-CM | POA: Diagnosis not present

## 2019-03-29 DIAGNOSIS — L570 Actinic keratosis: Secondary | ICD-10-CM | POA: Diagnosis not present

## 2019-03-29 DIAGNOSIS — D1801 Hemangioma of skin and subcutaneous tissue: Secondary | ICD-10-CM | POA: Diagnosis not present

## 2019-03-29 DIAGNOSIS — L308 Other specified dermatitis: Secondary | ICD-10-CM | POA: Diagnosis not present

## 2019-09-10 DIAGNOSIS — L570 Actinic keratosis: Secondary | ICD-10-CM | POA: Diagnosis not present

## 2019-09-14 ENCOUNTER — Other Ambulatory Visit: Payer: Self-pay | Admitting: Family Medicine

## 2019-09-14 DIAGNOSIS — M109 Gout, unspecified: Secondary | ICD-10-CM | POA: Diagnosis not present

## 2019-09-14 DIAGNOSIS — R1011 Right upper quadrant pain: Secondary | ICD-10-CM

## 2019-09-14 DIAGNOSIS — I10 Essential (primary) hypertension: Secondary | ICD-10-CM | POA: Diagnosis not present

## 2019-09-14 DIAGNOSIS — M179 Osteoarthritis of knee, unspecified: Secondary | ICD-10-CM | POA: Diagnosis not present

## 2019-09-14 DIAGNOSIS — Z6837 Body mass index (BMI) 37.0-37.9, adult: Secondary | ICD-10-CM | POA: Diagnosis not present

## 2019-09-25 ENCOUNTER — Other Ambulatory Visit: Payer: Self-pay

## 2019-11-02 ENCOUNTER — Other Ambulatory Visit: Payer: Self-pay

## 2019-11-11 DIAGNOSIS — Z20828 Contact with and (suspected) exposure to other viral communicable diseases: Secondary | ICD-10-CM | POA: Diagnosis not present

## 2019-11-11 DIAGNOSIS — Z03818 Encounter for observation for suspected exposure to other biological agents ruled out: Secondary | ICD-10-CM | POA: Diagnosis not present

## 2019-11-12 DIAGNOSIS — Z20828 Contact with and (suspected) exposure to other viral communicable diseases: Secondary | ICD-10-CM | POA: Diagnosis not present

## 2020-02-01 DIAGNOSIS — E663 Overweight: Secondary | ICD-10-CM | POA: Diagnosis not present

## 2020-02-01 DIAGNOSIS — I10 Essential (primary) hypertension: Secondary | ICD-10-CM | POA: Diagnosis not present

## 2020-02-01 DIAGNOSIS — M109 Gout, unspecified: Secondary | ICD-10-CM | POA: Diagnosis not present

## 2020-02-06 DIAGNOSIS — I10 Essential (primary) hypertension: Secondary | ICD-10-CM | POA: Diagnosis not present

## 2020-02-06 DIAGNOSIS — M109 Gout, unspecified: Secondary | ICD-10-CM | POA: Diagnosis not present

## 2020-02-06 DIAGNOSIS — E663 Overweight: Secondary | ICD-10-CM | POA: Diagnosis not present

## 2020-02-06 DIAGNOSIS — R739 Hyperglycemia, unspecified: Secondary | ICD-10-CM | POA: Diagnosis not present

## 2020-02-23 DIAGNOSIS — H6123 Impacted cerumen, bilateral: Secondary | ICD-10-CM | POA: Diagnosis not present

## 2020-02-25 ENCOUNTER — Other Ambulatory Visit: Payer: Self-pay

## 2020-02-25 ENCOUNTER — Ambulatory Visit (INDEPENDENT_AMBULATORY_CARE_PROVIDER_SITE_OTHER): Payer: BC Managed Care – PPO | Admitting: Otolaryngology

## 2020-02-25 ENCOUNTER — Encounter (INDEPENDENT_AMBULATORY_CARE_PROVIDER_SITE_OTHER): Payer: Self-pay | Admitting: Otolaryngology

## 2020-02-25 VITALS — Temp 97.2°F

## 2020-02-25 DIAGNOSIS — H90A22 Sensorineural hearing loss, unilateral, left ear, with restricted hearing on the contralateral side: Secondary | ICD-10-CM

## 2020-02-25 DIAGNOSIS — H6123 Impacted cerumen, bilateral: Secondary | ICD-10-CM | POA: Diagnosis not present

## 2020-02-25 NOTE — Progress Notes (Signed)
HPI: Darren [name] is a 59 y.o. male who presents for evaluation of blockage of his right ear.  He has had hearing loss in his left ear since he was a teenager.  His right ear which is his better hearing ear has been blocked now for several days and is getting ready to fly on a trip this Thursday..  Past Medical History:  Diagnosis Date  . Allergy   . Asthma   . Gout   . Ulcerative colitis ([place])    followed by Buccini  . Varicose veins    Past Surgical History:  Procedure Laterality Date  . HERNIA REPAIR     Social History   Socioeconomic History  . Marital status: Married    Spouse name: Not on file  . Number of children: Not on file  . Years of education: Not on file  . Highest education level: Not on file  Occupational History  . Not on file  Tobacco Use  . Smoking status: Never Smoker  . Smokeless tobacco: Never Used  Substance and Sexual Activity  . Alcohol use: Yes    Alcohol/week: 0.0 standard drinks    Comment: 10 drinks a week  . Drug use: No  . Sexual activity: Yes    Birth control/protection: Abstinence  Other Topics Concern  . Not on file  Social History Narrative   Married   3 children   Works with [employer] 3-4 days a week   Social Determinants of Radio broadcast assistant Strain:   . Difficulty of Paying Living Expenses:   Food Insecurity:   . Worried About Charity fundraiser in the Last Year:   . Arboriculturist in the Last Year:   Transportation Needs:   . Film/video editor (Medical):   Marland Kitchen Lack of Transportation (Non-Medical):   Physical Activity:   . Days of Exercise per Week:   . Minutes of Exercise per Session:   Stress:   . Feeling of Stress :   Social Connections:   . Frequency of Communication with Friends and Family:   . Frequency of Social Gatherings with Friends and Family:   . Attends Religious Services:   . Active Member of Clubs or Organizations:   . Attends Archivist Meetings:   Marland Kitchen  Marital Status:    Family History  Problem Relation Age of Onset  . Cancer Father        colon cancer   No Known Allergies Prior to Admission medications   Medication Sig Start Date End Date Taking? Authorizing Provider  albuterol (PROVENTIL HFA;VENTOLIN HFA) 108 (90 Base) MCG/ACT inhaler Inhale 2 puffs into the lungs every 6 (six) hours as needed. 03/22/17  Yes McVey, Gelene Mink, PA-C  cyclobenzaprine (FLEXERIL) 10 MG tablet Take 1 tablet (10 mg total) by mouth 3 (three) times daily as needed for muscle spasms. 03/22/17  Yes McVey, Gelene Mink, PA-C  Mesalamine 800 MG TBEC Take 2 tablets daily 01/14/14  Yes Copland, Gay Filler, MD  allopurinol (ZYLOPRIM) 300 MG tablet Take 1 tablet (300 mg total) by mouth daily. 06/07/18 09/05/18  Horald Pollen, MD  mesalamine (LIALDA) 1.2 g EC tablet Take 4 tablets (4.8 g total) by mouth daily with breakfast. 06/07/18 09/05/18  Horald Pollen, MD     Positive ROS: Otherwise negative  All other systems have been reviewed and were otherwise negative with the exception of those mentioned in the HPI and as above.  Physical Exam: Constitutional: Alert, well-appearing, no acute distress Ears: External ears without lesions or tenderness. Ear canals reveal a large amount of wax on both sides but worse on the right side.  This was cleaned with curettes and suction.  TMs were clear bilaterally.  Hearing was much better after cleaning both ear canals.  Although on tuning fork testing he has mild right ear hearing loss in addition to moderate left ear hearing loss.. Nasal: External nose without lesions. Clear nasal passages Oral: Oropharynx clear. Neck: No palpable adenopathy or masses Respiratory: Breathing comfortably  Skin: No facial/neck lesions or rash noted.  Cerumen impaction removal  Date/Time: 02/25/2020 6:26 PM Performed by: Rozetta Nunnery, MD Authorized by: Rozetta Nunnery, MD   Consent:    Consent obtained:   Verbal   Consent given by:  Patient   Risks discussed:  Pain and bleeding Procedure details:    Location:  L ear and R ear   Procedure type: curette and suction   Post-procedure details:    Inspection:  TM intact and canal normal   Hearing quality:  Improved   Patient tolerance of procedure:  Tolerated well, no immediate complications Comments:     TMs were clear bilaterally.  No middle ear fluid noted.  No middle ear abnormality noted. No TM abnormality noted on either side.    Assessment: Bilateral cerumen impactions right side worse than left. Longstanding left ear SNHL.  Plan: Ears were cleaned in the office and patient was doing much better after cleaning the ears.  He actually has hearing loss in both ears and would probably benefit by obtaining audiogram and briefly discussed this with him.  He would probably be a candidate for hearing aids although he has never used hearing aids in the past.  Radene Journey, MD

## 2020-03-24 DIAGNOSIS — E1165 Type 2 diabetes mellitus with hyperglycemia: Secondary | ICD-10-CM | POA: Diagnosis not present

## 2020-03-24 DIAGNOSIS — E11319 Type 2 diabetes mellitus with unspecified diabetic retinopathy without macular edema: Secondary | ICD-10-CM | POA: Diagnosis not present

## 2020-03-24 DIAGNOSIS — I10 Essential (primary) hypertension: Secondary | ICD-10-CM | POA: Diagnosis not present

## 2020-03-24 DIAGNOSIS — E1121 Type 2 diabetes mellitus with diabetic nephropathy: Secondary | ICD-10-CM | POA: Diagnosis not present

## 2020-08-18 DIAGNOSIS — R739 Hyperglycemia, unspecified: Secondary | ICD-10-CM | POA: Diagnosis not present

## 2020-08-18 DIAGNOSIS — R7989 Other specified abnormal findings of blood chemistry: Secondary | ICD-10-CM | POA: Diagnosis not present

## 2020-08-18 DIAGNOSIS — Z8639 Personal history of other endocrine, nutritional and metabolic disease: Secondary | ICD-10-CM | POA: Diagnosis not present

## 2020-08-18 DIAGNOSIS — E790 Hyperuricemia without signs of inflammatory arthritis and tophaceous disease: Secondary | ICD-10-CM | POA: Diagnosis not present

## 2020-08-20 DIAGNOSIS — I10 Essential (primary) hypertension: Secondary | ICD-10-CM | POA: Diagnosis not present

## 2020-08-20 DIAGNOSIS — E1165 Type 2 diabetes mellitus with hyperglycemia: Secondary | ICD-10-CM | POA: Diagnosis not present

## 2020-08-20 DIAGNOSIS — E782 Mixed hyperlipidemia: Secondary | ICD-10-CM | POA: Diagnosis not present

## 2020-08-20 DIAGNOSIS — M109 Gout, unspecified: Secondary | ICD-10-CM | POA: Diagnosis not present

## 2020-08-21 DIAGNOSIS — R7989 Other specified abnormal findings of blood chemistry: Secondary | ICD-10-CM | POA: Diagnosis not present

## 2020-08-21 DIAGNOSIS — E1165 Type 2 diabetes mellitus with hyperglycemia: Secondary | ICD-10-CM | POA: Diagnosis not present

## 2020-08-21 DIAGNOSIS — E669 Obesity, unspecified: Secondary | ICD-10-CM | POA: Diagnosis not present

## 2020-08-21 DIAGNOSIS — Z6836 Body mass index (BMI) 36.0-36.9, adult: Secondary | ICD-10-CM | POA: Diagnosis not present

## 2020-09-18 DIAGNOSIS — I10 Essential (primary) hypertension: Secondary | ICD-10-CM | POA: Diagnosis not present

## 2020-09-18 DIAGNOSIS — K5190 Ulcerative colitis, unspecified, without complications: Secondary | ICD-10-CM | POA: Diagnosis not present

## 2020-09-18 DIAGNOSIS — E1165 Type 2 diabetes mellitus with hyperglycemia: Secondary | ICD-10-CM | POA: Diagnosis not present

## 2021-01-01 DIAGNOSIS — D2261 Melanocytic nevi of right upper limb, including shoulder: Secondary | ICD-10-CM | POA: Diagnosis not present

## 2021-01-01 DIAGNOSIS — L570 Actinic keratosis: Secondary | ICD-10-CM | POA: Diagnosis not present

## 2021-01-01 DIAGNOSIS — L817 Pigmented purpuric dermatosis: Secondary | ICD-10-CM | POA: Diagnosis not present

## 2021-01-01 DIAGNOSIS — D225 Melanocytic nevi of trunk: Secondary | ICD-10-CM | POA: Diagnosis not present

## 2021-01-01 DIAGNOSIS — L821 Other seborrheic keratosis: Secondary | ICD-10-CM | POA: Diagnosis not present

## 2021-01-09 DIAGNOSIS — Z1331 Encounter for screening for depression: Secondary | ICD-10-CM | POA: Diagnosis not present

## 2021-01-09 DIAGNOSIS — Z1339 Encounter for screening examination for other mental health and behavioral disorders: Secondary | ICD-10-CM | POA: Diagnosis not present

## 2021-01-09 DIAGNOSIS — I10 Essential (primary) hypertension: Secondary | ICD-10-CM | POA: Diagnosis not present

## 2021-01-09 DIAGNOSIS — E1165 Type 2 diabetes mellitus with hyperglycemia: Secondary | ICD-10-CM | POA: Diagnosis not present

## 2021-01-30 DIAGNOSIS — M549 Dorsalgia, unspecified: Secondary | ICD-10-CM | POA: Diagnosis not present

## 2021-06-11 DIAGNOSIS — K5100 Ulcerative (chronic) pancolitis without complications: Secondary | ICD-10-CM | POA: Diagnosis not present

## 2021-08-10 DIAGNOSIS — D509 Iron deficiency anemia, unspecified: Secondary | ICD-10-CM | POA: Diagnosis not present

## 2021-08-10 DIAGNOSIS — Z125 Encounter for screening for malignant neoplasm of prostate: Secondary | ICD-10-CM | POA: Diagnosis not present

## 2021-08-10 DIAGNOSIS — M109 Gout, unspecified: Secondary | ICD-10-CM | POA: Diagnosis not present

## 2021-08-10 DIAGNOSIS — E1165 Type 2 diabetes mellitus with hyperglycemia: Secondary | ICD-10-CM | POA: Diagnosis not present

## 2021-08-17 DIAGNOSIS — Z1339 Encounter for screening examination for other mental health and behavioral disorders: Secondary | ICD-10-CM | POA: Diagnosis not present

## 2021-08-17 DIAGNOSIS — Z0000 Encounter for general adult medical examination without abnormal findings: Secondary | ICD-10-CM | POA: Diagnosis not present

## 2021-08-17 DIAGNOSIS — Z1331 Encounter for screening for depression: Secondary | ICD-10-CM | POA: Diagnosis not present

## 2021-08-17 DIAGNOSIS — I10 Essential (primary) hypertension: Secondary | ICD-10-CM | POA: Diagnosis not present

## 2022-02-16 DIAGNOSIS — E1169 Type 2 diabetes mellitus with other specified complication: Secondary | ICD-10-CM | POA: Diagnosis not present

## 2022-02-16 DIAGNOSIS — I10 Essential (primary) hypertension: Secondary | ICD-10-CM | POA: Diagnosis not present

## 2022-02-16 DIAGNOSIS — D509 Iron deficiency anemia, unspecified: Secondary | ICD-10-CM | POA: Diagnosis not present

## 2022-04-05 DIAGNOSIS — D509 Iron deficiency anemia, unspecified: Secondary | ICD-10-CM | POA: Diagnosis not present

## 2022-04-21 ENCOUNTER — Telehealth: Payer: Self-pay | Admitting: Hematology and Oncology

## 2022-04-21 NOTE — Telephone Encounter (Signed)
Scheduled appt per 7/11 referral. Pt is aware of appt date and time. Pt is aware to arrive 15 mins prior to appt time and to bring and updated insurance card. Pt is aware of appt location.

## 2022-05-24 ENCOUNTER — Encounter: Payer: Self-pay | Admitting: Hematology and Oncology

## 2022-05-24 ENCOUNTER — Other Ambulatory Visit: Payer: Self-pay

## 2022-05-24 ENCOUNTER — Inpatient Hospital Stay: Payer: BC Managed Care – PPO

## 2022-05-24 ENCOUNTER — Other Ambulatory Visit: Payer: Self-pay | Admitting: Hematology and Oncology

## 2022-05-24 ENCOUNTER — Inpatient Hospital Stay: Payer: BC Managed Care – PPO | Attending: Hematology and Oncology | Admitting: Hematology and Oncology

## 2022-05-24 VITALS — BP 138/87 | HR 64 | Temp 97.2°F | Resp 16 | Wt 269.1 lb

## 2022-05-24 DIAGNOSIS — E669 Obesity, unspecified: Secondary | ICD-10-CM | POA: Diagnosis not present

## 2022-05-24 DIAGNOSIS — K5190 Ulcerative colitis, unspecified, without complications: Secondary | ICD-10-CM | POA: Insufficient documentation

## 2022-05-24 DIAGNOSIS — Z800 Family history of malignant neoplasm of digestive organs: Secondary | ICD-10-CM | POA: Diagnosis not present

## 2022-05-24 DIAGNOSIS — D696 Thrombocytopenia, unspecified: Secondary | ICD-10-CM | POA: Insufficient documentation

## 2022-05-24 DIAGNOSIS — D509 Iron deficiency anemia, unspecified: Secondary | ICD-10-CM

## 2022-05-24 LAB — CBC WITH DIFFERENTIAL/PLATELET
Abs Immature Granulocytes: 0.01 10*3/uL (ref 0.00–0.07)
Basophils Absolute: 0 10*3/uL (ref 0.0–0.1)
Basophils Relative: 1 %
Eosinophils Absolute: 0.5 10*3/uL (ref 0.0–0.5)
Eosinophils Relative: 9 %
HCT: 42.2 % (ref 39.0–52.0)
Hemoglobin: 12.7 g/dL — ABNORMAL LOW (ref 13.0–17.0)
Immature Granulocytes: 0 %
Lymphocytes Relative: 14 %
Lymphs Abs: 0.7 10*3/uL (ref 0.7–4.0)
MCH: 22.6 pg — ABNORMAL LOW (ref 26.0–34.0)
MCHC: 30.1 g/dL (ref 30.0–36.0)
MCV: 75.1 fL — ABNORMAL LOW (ref 80.0–100.0)
Monocytes Absolute: 0.3 10*3/uL (ref 0.1–1.0)
Monocytes Relative: 7 %
Neutro Abs: 3.3 10*3/uL (ref 1.7–7.7)
Neutrophils Relative %: 69 %
Platelets: 115 10*3/uL — ABNORMAL LOW (ref 150–400)
RBC: 5.62 MIL/uL (ref 4.22–5.81)
RDW: 22.4 % — ABNORMAL HIGH (ref 11.5–15.5)
WBC: 4.8 10*3/uL (ref 4.0–10.5)
nRBC: 0 % (ref 0.0–0.2)

## 2022-05-24 LAB — FERRITIN: Ferritin: 20 ng/mL — ABNORMAL LOW (ref 24–336)

## 2022-05-24 LAB — IRON AND IRON BINDING CAPACITY (CC-WL,HP ONLY)
Iron: 41 ug/dL — ABNORMAL LOW (ref 45–182)
Saturation Ratios: 8 % — ABNORMAL LOW (ref 17.9–39.5)
TIBC: 487 ug/dL — ABNORMAL HIGH (ref 250–450)
UIBC: 446 ug/dL — ABNORMAL HIGH (ref 117–376)

## 2022-05-24 NOTE — Progress Notes (Signed)
Venofer plan placed per insurance requirement.   

## 2022-05-24 NOTE — Progress Notes (Signed)
Darren [name] CONSULT NOTE  Patient Care Team: Patient, No Pcp Per as PCP - General (General Practice)  CHIEF COMPLAINTS/PURPOSE OF CONSULTATION:  IDA  ASSESSMENT & PLAN:   Iron deficiency anemia This is a very pleasant 61 year old male patient with iron deficiency anemia likely secondary to ulcerative colitis versus malabsorption referred to hematology for evaluation and recommendations.  Mr. Adhrit had ulcerative colitis for almost 30+ years.  His last exacerbation was almost 7 years ago.  He denies any new complaints today except for ongoing fatigue.  No pica reported.  No hematochezia or melena.  He has been taking oral iron once a day and has been tolerating it reasonably well.  Physical examination today, obesity and midline hernia noted, otherwise bilateral chronic lower extremity varicosities.  We have reviewed his most recent labs which shows ferritin of 7.8, hypochromic microcytic anemia with a hemoglobin over 10 g/dL.  There is no indication for blood transfusion.  We have however discussed about oral versus intravenous iron.  I think is a reasonable candidate for IV iron given his ongoing fatigue and severe iron deficiency.  We have discussed the following findings about iron.  There are several formularies of intravenous iron available in the market.  We have discussed about risk of allergic/infusion reactions including potentially life-threatening anaphylaxis with intravenous iron however these serious allergic reactions are exceedingly rare and overestimated.  In contrast to serious allergic reactions, IV iron may be associated with nonallergic infusion reactions including self-limited urticaria, palpitations, dizziness, neck and back spasm which again occur in less than 1% of the individuals and do not progress to more serious reactions.  I have ordered Injectafer.  He understands that I am unsure of his deductible charges.  He may have to discuss with authorization team  for further information.   He will return to clinic in about 4 months.  He understands importance of colonoscopy especially given his family history and his personal history of ulcerative colitis.  Thrombocytopenia, mild, we will continue to monitor this.  We will repeat labs when he comes back for his next follow-up appointment. Thank you for consulting [place] the care of this patient.  Please not hesitate to contact us with any additional questions or concerns.      Orders Placed This Encounter  Procedures   CBC with Differential/Platelet    Standing Status:   Standing    Number of Occurrences:   22    Standing Expiration Date:   05/25/2023   Iron and Iron Binding Capacity ([facility] only)    Standing Status:   Future    Number of Occurrences:   1    Standing Expiration Date:   05/25/2023   Ferritin    Standing Status:   Future    Number of Occurrences:   1    Standing Expiration Date:   05/24/2023     HISTORY OF PRESENTING ILLNESS:   Darren [name] 61 y.o. male is here because of microcytic hypochromic iron deficiency anemia.  Most recent labs show hemoglobin of 10.3, MCV of 64.1, normal white blood cell count, mildly low platelet count Most recent ferritin at 7.8. He has a longstanding diagnosis of ulcerative colitis diagnosed about 33 yrs ago. Last UC exacerbation about 7 yrs ago. He has been taking oral iron once a day. He says the oral iron upsets the stomach. He denies any bleeding per rectum or melena. He wonders if iron is not well absorbed enough. Last colonoscopy was about 5 yrs  ago Today he complains of fatigue, otherwise no complaints.  Rest of the pertinent 10 point ROS reviewed and negative.  MEDICAL HISTORY:  Past Medical History:  Diagnosis Date   Allergy    Asthma    Gout    Ulcerative colitis ([place])    followed by Buccini   Varicose veins     SURGICAL HISTORY: Past Surgical History:  Procedure Laterality Date   HERNIA REPAIR      SOCIAL  HISTORY: Social History   Socioeconomic History   Marital status: Married    Spouse name: Not on file   Number of children: Not on file   Years of education: Not on file   Highest education level: Not on file  Occupational History   Not on file  Tobacco Use   Smoking status: Never   Smokeless tobacco: Never  Substance and Sexual Activity   Alcohol use: Yes    Alcohol/week: 0.0 standard drinks of alcohol    Comment: 10 drinks a week   Drug use: No   Sexual activity: Yes    Birth control/protection: Abstinence  Other Topics Concern   Not on file  Social History Narrative   Married   3 children   Works with [employer]   Exercises 3-4 days a week   Social Determinants of Radio broadcast assistant Strain: Not on file  Food Insecurity: Not on file  Transportation Needs: Not on file  Physical Activity: Not on file  Stress: Not on file  Social Connections: Not on file  Intimate Partner Violence: Not on file    FAMILY HISTORY: Family History  Problem Relation Age of Onset   Cancer Father        colon cancer    ALLERGIES:  has No Known Allergies.  MEDICATIONS:  Current Outpatient Medications  Medication Sig Dispense Refill   albuterol (PROVENTIL HFA;VENTOLIN HFA) 108 (90 Base) MCG/ACT inhaler Inhale 2 puffs into the lungs every 6 (six) hours as needed. 1 Inhaler 2   allopurinol (ZYLOPRIM) 300 MG tablet Take 1 tablet (300 mg total) by mouth daily. 90 tablet 3   cyclobenzaprine (FLEXERIL) 10 MG tablet Take 1 tablet (10 mg total) by mouth 3 (three) times daily as needed for muscle spasms. 30 tablet 0   mesalamine (LIALDA) 1.2 g EC tablet Take 4 tablets (4.8 g total) by mouth daily with breakfast. 360 tablet 3   Mesalamine 800 MG TBEC Take 2 tablets daily     No current facility-administered medications for this visit.     PHYSICAL EXAMINATION: ECOG PERFORMANCE STATUS: 0 - Asymptomatic  Vitals:   05/24/22 1006  BP: 138/87  Pulse: 64  Resp: 16  Temp:  (!) 97.2 F (36.2 C)  SpO2: 99%   Filed Weights   05/24/22 1006  Weight: 269 lb 2 oz (122.1 kg)    GENERAL:alert, no distress and comfortable, obese SKIN: skin color, texture, turgor are normal, no rashes or significant lesions EYES: normal, conjunctiva are pink and non-injected, sclera clear OROPHARYNX:no exudate, no erythema and lips, buccal mucosa, and tongue normal  NECK: supple, thyroid normal size, non-tender, without nodularity LYMPH:  no palpable lymphadenopathy in the cervical, axillary or inguinal LUNGS: clear to auscultation and percussion with normal breathing effort HEART: regular rate & rhythm and no murmurs and no lower extremity edema ABDOMEN:Midline abdominal hernia,  Musculoskeletal:no cyanosis of digits and no clubbing. Bilateral varicosities. PSYCH: alert & oriented x 3 with fluent speech NEURO: no focal motor/sensory deficits  LABORATORY  DATA:  I have reviewed the data as listed Lab Results  Component Value Date   WBC 6.8 03/22/2017   HGB 16.0 03/22/2017   HCT 42.5 (A) 03/22/2017   MCV 87.3 03/22/2017   PLT 171 11/28/2014     Chemistry      Component Value Date/Time   NA 143 03/22/2017 1808   K 3.8 03/22/2017 1808   CL 99 03/22/2017 1808   CO2 28 03/22/2017 1808   BUN 16 03/22/2017 1808   CREATININE 1.03 03/22/2017 1808   CREATININE 0.97 11/28/2014 1005      Component Value Date/Time   CALCIUM 9.8 03/22/2017 1808   ALKPHOS 64 03/22/2017 1808   AST 58 (H) 03/22/2017 1808   ALT 53 (H) 03/22/2017 1808   BILITOT 0.6 03/22/2017 1808       RADIOGRAPHIC STUDIES: I have personally reviewed the radiological images as listed and agreed with the findings in the report. No results found.  All questions were answered. The patient knows to call the clinic with any problems, questions or concerns. I spent 45 minutes in the care of this patient including H and P, review of records, counseling and coordination of care.     Benay Pike, MD 05/24/2022  11:25 AM

## 2022-05-24 NOTE — Assessment & Plan Note (Signed)
This is a very pleasant 61 year old male patient with iron deficiency anemia likely secondary to ulcerative colitis versus malabsorption referred to hematology for evaluation and recommendations.  Mr. Sammie had ulcerative colitis for almost 30+ years.  His last exacerbation was almost 7 years ago.  He denies any new complaints today except for ongoing fatigue.  No pica reported.  No hematochezia or melena.  He has been taking oral iron once a day and has been tolerating it reasonably well.  Physical examination today, obesity and midline hernia noted, otherwise bilateral chronic lower extremity varicosities.  We have reviewed his most recent labs which shows ferritin of 7.8, hypochromic microcytic anemia with a hemoglobin over 10 g/dL.  There is no indication for blood transfusion.  We have however discussed about oral versus intravenous iron.  I think is a reasonable candidate for IV iron given his ongoing fatigue and severe iron deficiency.  We have discussed the following findings about iron.  There are several formularies of intravenous iron available in the market.  We have discussed about risk of allergic/infusion reactions including potentially life-threatening anaphylaxis with intravenous iron however these serious allergic reactions are exceedingly rare and overestimated.  In contrast to serious allergic reactions, IV iron may be associated with nonallergic infusion reactions including self-limited urticaria, palpitations, dizziness, neck and back spasm which again occur in less than 1% of the individuals and do not progress to more serious reactions.  I have ordered Injectafer.  He understands that I am unsure of his deductible charges.  He may have to discuss with authorization team for further information.   He will return to clinic in about 4 months.  He understands importance of colonoscopy especially given his family history and his personal history of ulcerative colitis.  Thrombocytopenia,  mild, we will continue to monitor this.  We will repeat labs when he comes back for his next follow-up appointment. Thank you for consulting [place] the care of this patient.  Please not hesitate to contact us with any additional questions or concerns.

## 2022-05-25 ENCOUNTER — Telehealth: Payer: Self-pay | Admitting: Hematology and Oncology

## 2022-05-25 NOTE — Telephone Encounter (Signed)
Contacted patient to scheduled appointments. Patient is aware of appointments that are scheduled.

## 2022-05-26 ENCOUNTER — Telehealth: Payer: Self-pay | Admitting: *Deleted

## 2022-05-26 NOTE — Telephone Encounter (Addendum)
-----   Message from Benay Pike, MD sent at 05/24/2022  8:57 PM EDT ----- His ferritin came back and appears better. So knowing this information, he can continue oral iron and follow up in 3 months as an alternate option to IV iron.  Thanks,  This RN called pt and obtained identified VM- detailed message left per above with this RN's name and office number for return call.

## 2022-05-31 ENCOUNTER — Other Ambulatory Visit: Payer: Self-pay

## 2022-05-31 ENCOUNTER — Inpatient Hospital Stay: Payer: BC Managed Care – PPO

## 2022-05-31 VITALS — BP 156/88 | HR 62 | Temp 97.7°F | Resp 17

## 2022-05-31 DIAGNOSIS — D696 Thrombocytopenia, unspecified: Secondary | ICD-10-CM | POA: Diagnosis not present

## 2022-05-31 DIAGNOSIS — D509 Iron deficiency anemia, unspecified: Secondary | ICD-10-CM

## 2022-05-31 DIAGNOSIS — K5190 Ulcerative colitis, unspecified, without complications: Secondary | ICD-10-CM | POA: Diagnosis not present

## 2022-05-31 DIAGNOSIS — Z800 Family history of malignant neoplasm of digestive organs: Secondary | ICD-10-CM | POA: Diagnosis not present

## 2022-05-31 DIAGNOSIS — E669 Obesity, unspecified: Secondary | ICD-10-CM | POA: Diagnosis not present

## 2022-05-31 MED ORDER — SODIUM CHLORIDE 0.9 % IV SOLN
200.0000 mg | Freq: Once | INTRAVENOUS | Status: AC
  Administered 2022-05-31: 200 mg via INTRAVENOUS
  Filled 2022-05-31: qty 200

## 2022-05-31 MED ORDER — SODIUM CHLORIDE 0.9 % IV SOLN: Freq: Once | INTRAVENOUS | Status: AC

## 2022-05-31 NOTE — Progress Notes (Signed)
Patient would like to proceed with 1 round of iron and will decide if he will continue to receive iron at a later date.

## 2022-05-31 NOTE — Progress Notes (Signed)
Pt tolerated IV iron infusion well. Monitored for 30 minute post observation period without incident. VSS, ambulatory to lobby.

## 2022-05-31 NOTE — Patient Instructions (Signed)
Iron Deficiency Anemia, Adult  Iron deficiency anemia is a condition in which the concentration of red blood cells or hemoglobin in the blood is below normal because of too little iron. Hemoglobin is a substance in red blood cells that carries oxygen to the body's tissues. When the concentration of red blood cells or hemoglobin is too low, not enough oxygen reaches these tissues. Iron deficiency anemia is usually long-lasting, and it develops over time. It may or may not cause symptoms. It is a common type of anemia. What are the causes? This condition may be caused by: Not enough iron in the diet. Abnormal absorption in the gut. Blood loss. What increases the risk? You are more likely to develop this condition if you get menstrual periods (menstruate) or are pregnant. What are the signs or symptoms? Symptoms of this condition may include: Pale skin, lips, and nail beds. Weakness, dizziness, and getting tired easily. Shortness of breath when moving or exercising. Cold hands or feet. Mild anemia may not cause any symptoms. How is this diagnosed? This condition is diagnosed based on: Your medical history. A physical exam. Blood tests. How is this treated? This condition is treated by correcting the cause of your iron deficiency. Treatment may involve: Adding iron-rich foods to your diet. Taking iron supplements. If you are pregnant or breastfeeding, you may need to take extra iron because your normal diet usually does not provide the amount of iron that you need. Increasing vitamin C intake. Vitamin C helps your body absorb iron. Your health care provider may recommend that you take iron supplements along with a glass of orange juice or a vitamin C supplement. Medicines to make heavy menstrual flow lighter. Surgery or additional testing procedures to determine the cause of your anemia. You may need repeat blood tests to determine whether treatment is working. If the treatment does not  seem to be working, you may need more tests. Follow these instructions at home: Medicines Take over-the-counter and prescription medicines only as told by your health care provider. This includes iron supplements and vitamins. This is important because too much iron can be harmful. For the best iron absorption, you should take iron supplements when your stomach is empty. If you cannot tolerate them on an empty stomach, you may need to take them with food. Do not drink milk or take antacids at the same time as your iron supplements. Milk and antacids may interfere with how your body absorbs iron. Iron supplements may turn stool (feces) a darker color and it may appear black. If you cannot tolerate taking iron supplements by mouth, talk with your health care provider about taking them through an IV or through an injection into a muscle. Eating and drinking Talk with your health care provider before changing your diet. Your provider may recommend that you eat foods that contain a lot of iron, such as: Liver. Low-fat (lean) beef. Breads and cereals that have iron added to them (are fortified). Eggs. Dried fruit. Dark green, leafy vegetables. To help your body use the iron from iron-rich foods, eat those foods at the same time as fresh fruits and vegetables that are high in vitamin C. Foods that are high in vitamin C include: Oranges. Peppers. Tomatoes. Mangoes. Managing constipation If you are taking an iron supplement, it may cause constipation. To prevent or treat constipation, you may need to: Drink enough fluid to keep your urine pale yellow. Take over-the-counter or prescription medicines. Eat foods that are high in fiber, such  as beans, whole grains, and fresh fruits and vegetables. Limit foods that are high in fat and processed sugars, such as fried or sweet foods. General instructions Return to your normal activities as told by your health care provider. Ask your health care provider  what activities are safe for you. Keep all follow-up visits. Contact a health care provider if: You feel nauseous or you vomit. You feel weak. You become light-headed when getting up from a sitting or lying down position. You have unexplained sweating. You develop symptoms of constipation. You have a heaviness in your chest. You have trouble breathing with physical activity. Get help right away if: You faint. If this happens, do not drive yourself to the hospital. You have an irregular or rapid heartbeat. Summary Iron deficiency anemia is a condition in which the concentration of red blood cells or hemoglobin in the blood is below normal because of too little iron. This condition is treated by correcting the cause of your iron deficiency. Take over-the-counter and prescription medicines only as told by your health care provider. This includes iron supplements and vitamins. To help your body use the iron from iron-rich foods, eat those foods at the same time as fresh fruits and vegetables that are high in vitamin C. Seek medical help if you have signs or symptoms of worsening anemia. This information is not intended to replace advice given to you by your health care provider. Make sure you discuss any questions you have with your health care provider. Document Revised: 11/04/2021 Document Reviewed: 11/04/2021 Elsevier Patient Education  [place].

## 2022-06-02 ENCOUNTER — Inpatient Hospital Stay: Payer: BC Managed Care – PPO

## 2022-06-04 ENCOUNTER — Inpatient Hospital Stay: Payer: BC Managed Care – PPO

## 2022-06-07 ENCOUNTER — Other Ambulatory Visit: Payer: Self-pay

## 2022-06-07 ENCOUNTER — Inpatient Hospital Stay: Payer: BC Managed Care – PPO

## 2022-06-07 VITALS — BP 132/80 | HR 65 | Temp 97.8°F | Resp 16

## 2022-06-07 DIAGNOSIS — D509 Iron deficiency anemia, unspecified: Secondary | ICD-10-CM | POA: Diagnosis not present

## 2022-06-07 DIAGNOSIS — D696 Thrombocytopenia, unspecified: Secondary | ICD-10-CM | POA: Diagnosis not present

## 2022-06-07 DIAGNOSIS — K5190 Ulcerative colitis, unspecified, without complications: Secondary | ICD-10-CM | POA: Diagnosis not present

## 2022-06-07 DIAGNOSIS — Z800 Family history of malignant neoplasm of digestive organs: Secondary | ICD-10-CM | POA: Diagnosis not present

## 2022-06-07 DIAGNOSIS — E669 Obesity, unspecified: Secondary | ICD-10-CM | POA: Diagnosis not present

## 2022-06-07 MED ORDER — SODIUM CHLORIDE 0.9 % IV SOLN
200.0000 mg | Freq: Once | INTRAVENOUS | Status: AC
  Administered 2022-06-07: 200 mg via INTRAVENOUS
  Filled 2022-06-07: qty 200

## 2022-06-07 MED ORDER — SODIUM CHLORIDE 0.9 % IV SOLN: Freq: Once | INTRAVENOUS | Status: AC

## 2022-06-08 DIAGNOSIS — I10 Essential (primary) hypertension: Secondary | ICD-10-CM | POA: Diagnosis not present

## 2022-06-08 DIAGNOSIS — E1169 Type 2 diabetes mellitus with other specified complication: Secondary | ICD-10-CM | POA: Diagnosis not present

## 2022-06-09 ENCOUNTER — Inpatient Hospital Stay: Payer: BC Managed Care – PPO

## 2022-06-28 ENCOUNTER — Other Ambulatory Visit: Payer: Self-pay

## 2022-06-28 ENCOUNTER — Inpatient Hospital Stay: Payer: BC Managed Care – PPO | Attending: Hematology and Oncology

## 2022-06-28 VITALS — BP 136/88 | HR 64 | Temp 97.7°F | Resp 16

## 2022-06-28 DIAGNOSIS — D509 Iron deficiency anemia, unspecified: Secondary | ICD-10-CM | POA: Insufficient documentation

## 2022-06-28 MED ORDER — SODIUM CHLORIDE 0.9 % IV SOLN
200.0000 mg | Freq: Once | INTRAVENOUS | Status: DC
  Filled 2022-06-28: qty 10

## 2022-06-28 MED ORDER — SODIUM CHLORIDE 0.9 % IV SOLN: Freq: Once | INTRAVENOUS | Status: DC

## 2022-06-28 NOTE — Progress Notes (Signed)
Pt refused 3rd IV stick and does not want to receive IV Venofer today.  Pt stated " I have an appt on Wednesday, I will come then".  RN made MD aware.  Pt ambulatory to lobby in stable condition w/ no complaints.

## 2022-06-30 ENCOUNTER — Inpatient Hospital Stay: Payer: BC Managed Care – PPO

## 2022-06-30 ENCOUNTER — Other Ambulatory Visit: Payer: Self-pay

## 2022-06-30 VITALS — BP 141/98 | HR 87 | Temp 97.7°F | Resp 18

## 2022-06-30 DIAGNOSIS — D509 Iron deficiency anemia, unspecified: Secondary | ICD-10-CM

## 2022-06-30 MED ORDER — SODIUM CHLORIDE 0.9 % IV SOLN
200.0000 mg | Freq: Once | INTRAVENOUS | Status: AC
  Administered 2022-06-30: 200 mg via INTRAVENOUS
  Filled 2022-06-30: qty 200

## 2022-06-30 MED ORDER — SODIUM CHLORIDE 0.9 % IV SOLN: Freq: Once | INTRAVENOUS | Status: DC

## 2022-06-30 NOTE — Progress Notes (Signed)
Patiend refused 30 minute post iron infusion. Vss upon discharge.

## 2022-08-24 DIAGNOSIS — I10 Essential (primary) hypertension: Secondary | ICD-10-CM | POA: Diagnosis not present

## 2022-08-24 DIAGNOSIS — Z125 Encounter for screening for malignant neoplasm of prostate: Secondary | ICD-10-CM | POA: Diagnosis not present

## 2022-08-24 DIAGNOSIS — D509 Iron deficiency anemia, unspecified: Secondary | ICD-10-CM | POA: Diagnosis not present

## 2022-08-24 DIAGNOSIS — M109 Gout, unspecified: Secondary | ICD-10-CM | POA: Diagnosis not present

## 2022-08-24 DIAGNOSIS — E1169 Type 2 diabetes mellitus with other specified complication: Secondary | ICD-10-CM | POA: Diagnosis not present

## 2022-08-31 DIAGNOSIS — R82998 Other abnormal findings in urine: Secondary | ICD-10-CM | POA: Diagnosis not present

## 2022-09-07 DIAGNOSIS — I10 Essential (primary) hypertension: Secondary | ICD-10-CM | POA: Diagnosis not present

## 2022-09-07 DIAGNOSIS — Z1339 Encounter for screening examination for other mental health and behavioral disorders: Secondary | ICD-10-CM | POA: Diagnosis not present

## 2022-09-07 DIAGNOSIS — Z0000 Encounter for general adult medical examination without abnormal findings: Secondary | ICD-10-CM | POA: Diagnosis not present

## 2022-09-07 DIAGNOSIS — Z1331 Encounter for screening for depression: Secondary | ICD-10-CM | POA: Diagnosis not present

## 2022-09-13 DIAGNOSIS — K5100 Ulcerative (chronic) pancolitis without complications: Secondary | ICD-10-CM | POA: Diagnosis not present

## 2022-09-13 DIAGNOSIS — K648 Other hemorrhoids: Secondary | ICD-10-CM | POA: Diagnosis not present

## 2022-09-13 DIAGNOSIS — I8500 Esophageal varices without bleeding: Secondary | ICD-10-CM | POA: Diagnosis not present

## 2022-09-13 DIAGNOSIS — K2930 Chronic superficial gastritis without bleeding: Secondary | ICD-10-CM | POA: Diagnosis not present

## 2022-09-13 DIAGNOSIS — D509 Iron deficiency anemia, unspecified: Secondary | ICD-10-CM | POA: Diagnosis not present

## 2022-09-14 ENCOUNTER — Other Ambulatory Visit: Payer: Self-pay | Admitting: Gastroenterology

## 2022-09-14 DIAGNOSIS — D696 Thrombocytopenia, unspecified: Secondary | ICD-10-CM

## 2022-09-14 DIAGNOSIS — K3189 Other diseases of stomach and duodenum: Secondary | ICD-10-CM

## 2022-09-23 ENCOUNTER — Ambulatory Visit
Admission: RE | Admit: 2022-09-23 | Discharge: 2022-09-23 | Disposition: A | Discharge status: Home / Self Care | Payer: BC Managed Care – PPO | Source: Ambulatory Visit | Attending: Gastroenterology | Admitting: Gastroenterology

## 2022-09-23 DIAGNOSIS — K3189 Other diseases of stomach and duodenum: Secondary | ICD-10-CM

## 2022-09-23 DIAGNOSIS — D696 Thrombocytopenia, unspecified: Secondary | ICD-10-CM

## 2022-09-23 DIAGNOSIS — I10 Essential (primary) hypertension: Secondary | ICD-10-CM | POA: Diagnosis not present

## 2022-09-23 DIAGNOSIS — K828 Other specified diseases of gallbladder: Secondary | ICD-10-CM | POA: Diagnosis not present

## 2022-09-23 DIAGNOSIS — K8020 Calculus of gallbladder without cholecystitis without obstruction: Secondary | ICD-10-CM | POA: Diagnosis not present

## 2022-09-27 ENCOUNTER — Other Ambulatory Visit (HOSPITAL_COMMUNITY): Payer: Self-pay | Admitting: Gastroenterology

## 2022-09-27 DIAGNOSIS — R935 Abnormal findings on diagnostic imaging of other abdominal regions, including retroperitoneum: Secondary | ICD-10-CM

## 2022-10-01 ENCOUNTER — Ambulatory Visit (HOSPITAL_BASED_OUTPATIENT_CLINIC_OR_DEPARTMENT_OTHER): Payer: BC Managed Care – PPO

## 2022-10-05 ENCOUNTER — Ambulatory Visit: Payer: BC Managed Care – PPO

## 2022-10-05 DIAGNOSIS — K862 Cyst of pancreas: Secondary | ICD-10-CM | POA: Diagnosis not present

## 2022-10-05 DIAGNOSIS — R935 Abnormal findings on diagnostic imaging of other abdominal regions, including retroperitoneum: Secondary | ICD-10-CM

## 2022-10-05 DIAGNOSIS — K828 Other specified diseases of gallbladder: Secondary | ICD-10-CM | POA: Diagnosis not present

## 2022-10-05 DIAGNOSIS — I864 Gastric varices: Secondary | ICD-10-CM | POA: Diagnosis not present

## 2022-10-05 DIAGNOSIS — K7460 Unspecified cirrhosis of liver: Secondary | ICD-10-CM | POA: Diagnosis not present

## 2022-10-05 MED ORDER — GADOBUTROL 1 MMOL/ML IV SOLN
10.0000 mL | Freq: Once | INTRAVENOUS | Status: AC | PRN
  Administered 2022-10-05: 10 mL via INTRAVENOUS

## 2022-10-07 ENCOUNTER — Encounter (HOSPITAL_COMMUNITY): Payer: Self-pay

## 2022-10-07 ENCOUNTER — Ambulatory Visit (HOSPITAL_COMMUNITY): Payer: BC Managed Care – PPO

## 2022-11-22 ENCOUNTER — Inpatient Hospital Stay: Payer: BC Managed Care – PPO | Admitting: Hematology and Oncology

## 2023-03-07 ENCOUNTER — Other Ambulatory Visit: Payer: Self-pay | Admitting: Emergency Medicine

## 2023-03-07 DIAGNOSIS — K5190 Ulcerative colitis, unspecified, without complications: Secondary | ICD-10-CM

## 2023-04-11 DIAGNOSIS — I10 Essential (primary) hypertension: Secondary | ICD-10-CM | POA: Diagnosis not present

## 2023-04-11 DIAGNOSIS — E1169 Type 2 diabetes mellitus with other specified complication: Secondary | ICD-10-CM | POA: Diagnosis not present

## 2023-05-16 ENCOUNTER — Encounter (HOSPITAL_COMMUNITY): Payer: Self-pay

## 2023-05-16 ENCOUNTER — Other Ambulatory Visit: Payer: Self-pay

## 2023-05-16 ENCOUNTER — Emergency Department (HOSPITAL_COMMUNITY): Payer: BC Managed Care – PPO

## 2023-05-16 ENCOUNTER — Inpatient Hospital Stay (HOSPITAL_COMMUNITY)
Admission: EM | Admit: 2023-05-16 | Discharge: 2023-05-21 | DRG: 432 | Disposition: A | Discharge status: Home / Self Care | Payer: BC Managed Care – PPO | Attending: Internal Medicine | Admitting: Internal Medicine

## 2023-05-16 DIAGNOSIS — K921 Melena: Principal | ICD-10-CM

## 2023-05-16 DIAGNOSIS — K2980 Duodenitis without bleeding: Secondary | ICD-10-CM | POA: Diagnosis not present

## 2023-05-16 DIAGNOSIS — G8929 Other chronic pain: Secondary | ICD-10-CM | POA: Diagnosis present

## 2023-05-16 DIAGNOSIS — K7030 Alcoholic cirrhosis of liver without ascites: Principal | ICD-10-CM | POA: Diagnosis present

## 2023-05-16 DIAGNOSIS — K2970 Gastritis, unspecified, without bleeding: Secondary | ICD-10-CM | POA: Diagnosis not present

## 2023-05-16 DIAGNOSIS — E1165 Type 2 diabetes mellitus with hyperglycemia: Secondary | ICD-10-CM | POA: Diagnosis not present

## 2023-05-16 DIAGNOSIS — M778 Other enthesopathies, not elsewhere classified: Secondary | ICD-10-CM | POA: Diagnosis present

## 2023-05-16 DIAGNOSIS — D684 Acquired coagulation factor deficiency: Secondary | ICD-10-CM | POA: Diagnosis not present

## 2023-05-16 DIAGNOSIS — K51811 Other ulcerative colitis with rectal bleeding: Secondary | ICD-10-CM | POA: Diagnosis present

## 2023-05-16 DIAGNOSIS — K766 Portal hypertension: Secondary | ICD-10-CM | POA: Diagnosis present

## 2023-05-16 DIAGNOSIS — F1010 Alcohol abuse, uncomplicated: Secondary | ICD-10-CM | POA: Diagnosis present

## 2023-05-16 DIAGNOSIS — T454X6A Underdosing of iron and its compounds, initial encounter: Secondary | ICD-10-CM | POA: Diagnosis present

## 2023-05-16 DIAGNOSIS — Z7982 Long term (current) use of aspirin: Secondary | ICD-10-CM

## 2023-05-16 DIAGNOSIS — Z91199 Patient's noncompliance with other medical treatment and regimen due to unspecified reason: Secondary | ICD-10-CM

## 2023-05-16 DIAGNOSIS — D696 Thrombocytopenia, unspecified: Secondary | ICD-10-CM | POA: Diagnosis present

## 2023-05-16 DIAGNOSIS — K922 Gastrointestinal hemorrhage, unspecified: Secondary | ICD-10-CM | POA: Diagnosis present

## 2023-05-16 DIAGNOSIS — M109 Gout, unspecified: Secondary | ICD-10-CM | POA: Diagnosis present

## 2023-05-16 DIAGNOSIS — I959 Hypotension, unspecified: Secondary | ICD-10-CM | POA: Diagnosis not present

## 2023-05-16 DIAGNOSIS — D649 Anemia, unspecified: Secondary | ICD-10-CM

## 2023-05-16 DIAGNOSIS — I700 Atherosclerosis of aorta: Secondary | ICD-10-CM | POA: Diagnosis not present

## 2023-05-16 DIAGNOSIS — R578 Other shock: Secondary | ICD-10-CM

## 2023-05-16 DIAGNOSIS — K5190 Ulcerative colitis, unspecified, without complications: Secondary | ICD-10-CM | POA: Diagnosis not present

## 2023-05-16 DIAGNOSIS — Z6836 Body mass index (BMI) 36.0-36.9, adult: Secondary | ICD-10-CM | POA: Diagnosis not present

## 2023-05-16 DIAGNOSIS — K633 Ulcer of intestine: Secondary | ICD-10-CM | POA: Diagnosis not present

## 2023-05-16 DIAGNOSIS — I8501 Esophageal varices with bleeding: Secondary | ICD-10-CM | POA: Diagnosis not present

## 2023-05-16 DIAGNOSIS — K8020 Calculus of gallbladder without cholecystitis without obstruction: Secondary | ICD-10-CM | POA: Diagnosis not present

## 2023-05-16 DIAGNOSIS — I10 Essential (primary) hypertension: Secondary | ICD-10-CM | POA: Diagnosis not present

## 2023-05-16 DIAGNOSIS — I8511 Secondary esophageal varices with bleeding: Secondary | ICD-10-CM | POA: Diagnosis present

## 2023-05-16 DIAGNOSIS — R739 Hyperglycemia, unspecified: Secondary | ICD-10-CM | POA: Diagnosis not present

## 2023-05-16 DIAGNOSIS — Z1152 Encounter for screening for COVID-19: Secondary | ICD-10-CM

## 2023-05-16 DIAGNOSIS — R5084 Febrile nonhemolytic transfusion reaction: Secondary | ICD-10-CM | POA: Diagnosis not present

## 2023-05-16 DIAGNOSIS — D509 Iron deficiency anemia, unspecified: Secondary | ICD-10-CM | POA: Diagnosis not present

## 2023-05-16 DIAGNOSIS — K863 Pseudocyst of pancreas: Secondary | ICD-10-CM | POA: Diagnosis present

## 2023-05-16 DIAGNOSIS — Z800 Family history of malignant neoplasm of digestive organs: Secondary | ICD-10-CM

## 2023-05-16 DIAGNOSIS — D62 Acute posthemorrhagic anemia: Secondary | ICD-10-CM | POA: Diagnosis present

## 2023-05-16 DIAGNOSIS — I864 Gastric varices: Secondary | ICD-10-CM | POA: Diagnosis present

## 2023-05-16 DIAGNOSIS — R188 Other ascites: Secondary | ICD-10-CM | POA: Diagnosis not present

## 2023-05-16 DIAGNOSIS — I781 Nevus, non-neoplastic: Secondary | ICD-10-CM | POA: Diagnosis not present

## 2023-05-16 DIAGNOSIS — K5100 Ulcerative (chronic) pancolitis without complications: Secondary | ICD-10-CM | POA: Diagnosis not present

## 2023-05-16 DIAGNOSIS — R0989 Other specified symptoms and signs involving the circulatory and respiratory systems: Secondary | ICD-10-CM | POA: Diagnosis not present

## 2023-05-16 DIAGNOSIS — R161 Splenomegaly, not elsewhere classified: Secondary | ICD-10-CM | POA: Diagnosis not present

## 2023-05-16 DIAGNOSIS — N1330 Unspecified hydronephrosis: Secondary | ICD-10-CM | POA: Diagnosis not present

## 2023-05-16 DIAGNOSIS — K7460 Unspecified cirrhosis of liver: Secondary | ICD-10-CM | POA: Diagnosis not present

## 2023-05-16 DIAGNOSIS — D6959 Other secondary thrombocytopenia: Secondary | ICD-10-CM | POA: Diagnosis present

## 2023-05-16 DIAGNOSIS — I2510 Atherosclerotic heart disease of native coronary artery without angina pectoris: Secondary | ICD-10-CM | POA: Diagnosis not present

## 2023-05-16 DIAGNOSIS — K648 Other hemorrhoids: Secondary | ICD-10-CM | POA: Diagnosis present

## 2023-05-16 DIAGNOSIS — J45909 Unspecified asthma, uncomplicated: Secondary | ICD-10-CM | POA: Diagnosis present

## 2023-05-16 DIAGNOSIS — R197 Diarrhea, unspecified: Secondary | ICD-10-CM | POA: Diagnosis not present

## 2023-05-16 DIAGNOSIS — I8500 Esophageal varices without bleeding: Secondary | ICD-10-CM | POA: Diagnosis not present

## 2023-05-16 DIAGNOSIS — K209 Esophagitis, unspecified without bleeding: Secondary | ICD-10-CM | POA: Diagnosis present

## 2023-05-16 DIAGNOSIS — J4520 Mild intermittent asthma, uncomplicated: Secondary | ICD-10-CM | POA: Diagnosis not present

## 2023-05-16 DIAGNOSIS — Z79899 Other long term (current) drug therapy: Secondary | ICD-10-CM

## 2023-05-16 DIAGNOSIS — K625 Hemorrhage of anus and rectum: Secondary | ICD-10-CM | POA: Diagnosis not present

## 2023-05-16 DIAGNOSIS — R42 Dizziness and giddiness: Secondary | ICD-10-CM | POA: Diagnosis not present

## 2023-05-16 LAB — BPAM RBC
Blood Product Expiration Date: 202409052359
Blood Product Expiration Date: 202409052359
ISSUE DATE / TIME: 202408051554
ISSUE DATE / TIME: 202408051554
Unit Type and Rh: 5100
Unit Type and Rh: 5100

## 2023-05-16 LAB — ABO/RH: ABO/RH(D): A POS

## 2023-05-16 LAB — TYPE AND SCREEN
ABO/RH(D): A POS
Antibody Screen: NEGATIVE
Unit division: 0
Unit division: 0

## 2023-05-16 LAB — CBC
HCT: 22 % — ABNORMAL LOW (ref 39.0–52.0)
Hemoglobin: 5.8 g/dL — CL (ref 13.0–17.0)
MCH: 16.8 pg — ABNORMAL LOW (ref 26.0–34.0)
MCHC: 26.4 g/dL — ABNORMAL LOW (ref 30.0–36.0)
MCV: 63.6 fL — ABNORMAL LOW (ref 80.0–100.0)
Platelets: 188 10*3/uL (ref 150–400)
RBC: 3.46 MIL/uL — ABNORMAL LOW (ref 4.22–5.81)
RDW: 20.4 % — ABNORMAL HIGH (ref 11.5–15.5)
WBC: 11.7 10*3/uL — ABNORMAL HIGH (ref 4.0–10.5)
nRBC: 0 % (ref 0.0–0.2)

## 2023-05-16 LAB — URINALYSIS, W/ REFLEX TO CULTURE (INFECTION SUSPECTED)
Bacteria, UA: NONE SEEN
Bilirubin Urine: NEGATIVE
Glucose, UA: >=500 mg/dL — AB
Hgb urine dipstick: NEGATIVE
Ketones, ur: 5 mg/dL — AB
Leukocytes,Ua: NEGATIVE
Nitrite: NEGATIVE
Protein, ur: NEGATIVE mg/dL
Specific Gravity, Urine: 1.031 — ABNORMAL HIGH (ref 1.005–1.030)
pH: 5 (ref 5.0–8.0)

## 2023-05-16 LAB — TROPONIN I (HIGH SENSITIVITY)
Troponin I (High Sensitivity): 6 ng/L (ref <18)
Troponin I (High Sensitivity): 6 ng/L (ref <18)

## 2023-05-16 LAB — COMPREHENSIVE METABOLIC PANEL
ALT: 29 U/L (ref 0–44)
AST: 29 U/L (ref 15–41)
Albumin: 2.9 g/dL — ABNORMAL LOW (ref 3.5–5.0)
Alkaline Phosphatase: 55 U/L (ref 38–126)
Anion gap: 13 (ref 5–15)
BUN: 38 mg/dL — ABNORMAL HIGH (ref 8–23)
CO2: 20 mmol/L — ABNORMAL LOW (ref 22–32)
Calcium: 9 mg/dL (ref 8.9–10.3)
Chloride: 101 mmol/L (ref 98–111)
Creatinine, Ser: 0.9 mg/dL (ref 0.61–1.24)
GFR, Estimated: >60 mL/min (ref >60)
Glucose, Bld: 492 mg/dL — ABNORMAL HIGH (ref 70–99)
Potassium: 5 mmol/L (ref 3.5–5.1)
Sodium: 134 mmol/L — ABNORMAL LOW (ref 135–145)
Total Bilirubin: 1.4 mg/dL — ABNORMAL HIGH (ref 0.3–1.2)
Total Protein: 5 g/dL — ABNORMAL LOW (ref 6.5–8.1)

## 2023-05-16 LAB — PROTIME-INR
INR: 1.4 — ABNORMAL HIGH (ref 0.8–1.2)
Prothrombin Time: 17.7 seconds — ABNORMAL HIGH (ref 11.4–15.2)

## 2023-05-16 LAB — LIPASE, BLOOD: Lipase: 40 U/L (ref 11–51)

## 2023-05-16 LAB — POC OCCULT BLOOD, ED: Fecal Occult Bld: POSITIVE — AB

## 2023-05-16 LAB — PREPARE RBC (CROSSMATCH)

## 2023-05-16 MED ORDER — SODIUM CHLORIDE 0.9 % IV BOLUS
1000.0000 mL | Freq: Once | INTRAVENOUS | Status: AC
  Administered 2023-05-17: 1000 mL via INTRAVENOUS

## 2023-05-16 MED ORDER — PANTOPRAZOLE 80MG IVPB - SIMPLE MED
80.0000 mg | Freq: Once | INTRAVENOUS | Status: AC
  Administered 2023-05-16: 80 mg via INTRAVENOUS
  Filled 2023-05-16 (×2): qty 100

## 2023-05-16 MED ORDER — SODIUM CHLORIDE 0.9 % IV SOLN
1.0000 g | Freq: Once | INTRAVENOUS | Status: AC
  Administered 2023-05-17: 1 g via INTRAVENOUS

## 2023-05-16 MED ORDER — SODIUM CHLORIDE 0.9 % IV SOLN
1.0000 g | INTRAVENOUS | Status: DC
  Filled 2023-05-16: qty 10

## 2023-05-16 MED ORDER — PHYTONADIONE 5 MG PO TABS
10.0000 mg | ORAL_TABLET | Freq: Once | ORAL | Status: AC
  Administered 2023-05-16: 10 mg via ORAL
  Filled 2023-05-16: qty 2

## 2023-05-16 MED ORDER — IOHEXOL 350 MG/ML SOLN
150.0000 mL | Freq: Once | INTRAVENOUS | Status: AC | PRN
  Administered 2023-05-16: 150 mL via INTRAVENOUS

## 2023-05-16 MED ORDER — ONDANSETRON HCL 4 MG PO TABS: 4.0000 mg | ORAL_TABLET | Freq: Four times a day (QID) | ORAL | Status: DC | PRN

## 2023-05-16 MED ORDER — SODIUM CHLORIDE 0.9 % IV BOLUS: 1000.0000 mL | Freq: Once | INTRAVENOUS | Status: DC

## 2023-05-16 MED ORDER — ZOLPIDEM TARTRATE 5 MG PO TABS: 5.0000 mg | ORAL_TABLET | Freq: Every evening | ORAL | Status: DC | PRN

## 2023-05-16 MED ORDER — SODIUM CHLORIDE 0.9 % IV SOLN: INTRAVENOUS | Status: DC

## 2023-05-16 MED ORDER — ONDANSETRON HCL 4 MG/2ML IJ SOLN: 4.0000 mg | Freq: Four times a day (QID) | INTRAMUSCULAR | Status: DC | PRN

## 2023-05-16 MED ORDER — OCTREOTIDE LOAD VIA INFUSION
50.0000 ug | Freq: Once | INTRAVENOUS | Status: DC
  Filled 2023-05-16: qty 25

## 2023-05-16 MED ORDER — SODIUM CHLORIDE 0.9 % IV BOLUS
1000.0000 mL | Freq: Once | INTRAVENOUS | Status: AC
  Administered 2023-05-16: 1000 mL via INTRAVENOUS

## 2023-05-16 MED ORDER — SODIUM CHLORIDE 0.9 % IV SOLN
50.0000 ug/h | INTRAVENOUS | Status: AC
  Administered 2023-05-16 – 2023-05-19 (×6): 50 ug/h via INTRAVENOUS
  Filled 2023-05-16 (×6): qty 1

## 2023-05-16 MED ORDER — ALLOPURINOL 300 MG PO TABS
300.0000 mg | ORAL_TABLET | Freq: Every day | ORAL | Status: DC
  Administered 2023-05-18 – 2023-05-21 (×5): 300 mg via ORAL
  Filled 2023-05-16: qty 3
  Filled 2023-05-16 (×2): qty 1
  Filled 2023-05-16 (×2): qty 3

## 2023-05-16 MED ORDER — PANTOPRAZOLE INFUSION (NEW) - SIMPLE MED
8.0000 mg/h | INTRAVENOUS | Status: DC
  Administered 2023-05-16 – 2023-05-17 (×3): 8 mg/h via INTRAVENOUS
  Filled 2023-05-16 (×4): qty 100

## 2023-05-16 MED ORDER — ONDANSETRON HCL 4 MG/2ML IJ SOLN: 4.0000 mg | Freq: Once | INTRAMUSCULAR | Status: DC

## 2023-05-16 MED ORDER — SODIUM CHLORIDE 0.9 % IV SOLN: 1.0000 g | INTRAVENOUS | Status: DC

## 2023-05-16 MED ORDER — SODIUM CHLORIDE 0.9% IV SOLUTION: Freq: Once | INTRAVENOUS | Status: DC

## 2023-05-16 MED ORDER — THIAMINE HCL 100 MG/ML IJ SOLN
100.0000 mg | Freq: Every day | INTRAMUSCULAR | Status: AC
  Administered 2023-05-16 – 2023-05-18 (×3): 100 mg via INTRAVENOUS
  Filled 2023-05-16 (×3): qty 2

## 2023-05-16 MED ORDER — FENTANYL CITRATE PF 50 MCG/ML IJ SOSY
25.0000 ug | PREFILLED_SYRINGE | Freq: Once | INTRAMUSCULAR | Status: AC
  Administered 2023-05-16: 25 ug via INTRAVENOUS
  Filled 2023-05-16: qty 1

## 2023-05-16 MED ORDER — ALBUTEROL SULFATE (2.5 MG/3ML) 0.083% IN NEBU: 3.0000 mL | INHALATION_SOLUTION | Freq: Four times a day (QID) | RESPIRATORY_TRACT | Status: DC | PRN

## 2023-05-16 MED ORDER — ACETAMINOPHEN 500 MG PO TABS: 500.0000 mg | ORAL_TABLET | Freq: Four times a day (QID) | ORAL | Status: DC | PRN

## 2023-05-16 NOTE — ED Triage Notes (Signed)
Pt c/o hypotension 91 systolic while lying. Pt c/o dizziness, diarrhea and nauseated. Pt c/o black stools since midnight. Pt denies blood thinners

## 2023-05-16 NOTE — ED Provider Notes (Signed)
[place] EMERGENCY DEPARTMENT AT [facility] Provider Note   CSN: [id number] Arrival date & time: 05/16/23  1229     History  Chief Complaint  Patient presents with  . Hypotension    Darren [name] is a 62 y.o. male here for evaluation of hypotension and dark stool.  Patient states he is unwell the last 3 days.  He has had epigastric abdominal pain which she describes as aching, gnawing.  Does not radiate.  He has had overall decreased appetite and has not had any thing to eat or drink today.  Some nausea that vomiting.  No chest pain.  No new unilateral leg swelling, pain. Has chronic pain to left heel which he was told by PCP was tendonitis. No hx of PE,DVT. No pain, swelling to calves BIL.  No PND or orthopnea.  States today he has had greater than 15 episodes of black watery stool.  He denies any further bright red blood per rectum.  States he takes aspirin daily however is unsure of the dose.  No chronic NSAID use.  He drinks about 10 alcoholic beverages a week.  He denies any history of DTs, withdrawal seizures.  He also denies any prior history of esophageal varices or cirrhosis.  States he follows with [facility] gastroenterology had colonoscopy and endoscopy in November which according to him did not show significant findings.  He also states he had an MRI which showed "chronic pockets" in his pancreas however these were not cancerous.  He does take valsartan for blood pressure.  No fever, back pain, cough, rashes, lesions. Mild SOB, and extreme fatigue and gen weakness. Hx of iron transfusions, last in 2023. No hx of blood transfusions per patient.    HPI     Home Medications Prior to Admission medications   Medication Sig Start Date End Date Taking? Authorizing Provider  albuterol (PROVENTIL HFA;VENTOLIN HFA) 108 (90 Base) MCG/ACT inhaler Inhale 2 puffs into the lungs every 6 (six) hours as needed. 03/22/17  Yes McVey, Madelaine Bhat, PA-C  allopurinol (ZYLOPRIM) 300 MG  tablet Take 1 tablet (300 mg total) by mouth daily. 06/07/18 05/16/23 Yes Sagardia, Eilleen Kempf, MD  ASPIRIN PO Take 2 tablets by mouth as needed (Takes as needed for pain in his knees).   Yes [provider]  Mesalamine 800 MG TBEC Take 1 tablet by mouth daily. 01/14/14  Yes Copland, Gwenlyn Found, MD  valsartan (DIOVAN) 160 MG tablet Take 160 mg by mouth daily.   Yes [provider]      Allergies    Patient has no known allergies.    Review of Systems   Review of Systems  Constitutional:  Positive for activity change, appetite change and fatigue. Negative for fever.  HENT: Negative.    Respiratory:  Positive for shortness of breath.   Cardiovascular: Negative.   Gastrointestinal:  Positive for abdominal pain, blood in stool, diarrhea and nausea. Negative for abdominal distention, anal bleeding, constipation, rectal pain and vomiting.  Genitourinary: Negative.   Musculoskeletal: Negative.   Skin:  Positive for pallor.  Neurological:  Positive for weakness (genalized weakness) and light-headedness. Negative for dizziness, tremors, seizures, syncope, facial asymmetry, speech difficulty, numbness and headaches.  All other systems reviewed and are negative.   Physical Exam Updated Vital Signs BP 120/60   Pulse (!) 109   Temp 98 F (36.7 C) (Oral)   Resp (!) 28   Ht 6' (1.829 m)   Wt 122.1 kg   SpO2 100%  BMI 36.51 kg/m  Physical Exam Vitals and nursing note reviewed. Exam conducted with a chaperone present.  Constitutional:      General: He is in acute distress.     Appearance: He is well-developed. He is ill-appearing. He is not diaphoretic.  HENT:     Head: Atraumatic.     Nose: Nose normal.     Mouth/Throat:     Mouth: Mucous membranes are dry.  Eyes:     Pupils: Pupils are equal, round, and reactive to light.  Cardiovascular:     Rate and Rhythm: Regular rhythm. Tachycardia present.     Pulses: Normal pulses.          Radial pulses are 2+ on the right  side and 2+ on the left side.       Dorsalis pedis pulses are 2+ on the right side and 2+ on the left side.     Heart sounds: Normal heart sounds.  Pulmonary:     Effort: Pulmonary effort is normal. No respiratory distress.     Breath sounds: Normal breath sounds and air entry.     Comments: Speaks in full sentences without difficulty Abdominal:     General: Bowel sounds are normal. There is distension.     Palpations: Abdomen is soft.     Tenderness: There is abdominal tenderness in the epigastric area. There is no right CVA tenderness, left CVA tenderness, guarding or rebound. Negative signs include Murphy's sign and McBurney's sign.     Hernia: No hernia is present.     Comments: Soft, tenderness to epigastric region  Genitourinary:    Rectum: Guaiac result positive. No tenderness or anal fissure.     Comments: Nurse present in room, gross melena on rectal exam Musculoskeletal:        General: No swelling, tenderness, deformity or signs of injury. Normal range of motion.     Cervical back: Normal range of motion and neck supple.     Right lower leg: No edema.     Left lower leg: No edema.     Comments: Calves soft, nontender, full range of motion  Skin:    General: Skin is warm and dry.     Capillary Refill: Capillary refill takes 2 to 3 seconds.     Coloration: Skin is pale.  Neurological:     General: No focal deficit present.     Mental Status: He is alert and oriented to person, place, and time.     Cranial Nerves: No cranial nerve deficit.     Motor: No weakness.     Gait: Gait normal.    ED Results / Procedures / Treatments   Labs (all labs ordered are listed, but only abnormal results are displayed) Labs Reviewed  COMPREHENSIVE METABOLIC PANEL - Abnormal; Notable for the following components:      Result Value   Sodium 134 (*)    CO2 20 (*)    Glucose, Bld 492 (*)    BUN 38 (*)    Total Protein 5.0 (*)    Albumin 2.9 (*)    Total Bilirubin 1.4 (*)    All other  components within normal limits  CBC - Abnormal; Notable for the following components:   WBC 11.7 (*)    RBC 3.46 (*)    Hemoglobin 5.8 (*)    HCT 22.0 (*)    MCV 63.6 (*)    MCH 16.8 (*)    MCHC 26.4 (*)    RDW 20.4 (*)  All other components within normal limits  PROTIME-INR - Abnormal; Notable for the following components:   Prothrombin Time 17.7 (*)    INR 1.4 (*)    All other components within normal limits  URINALYSIS, W/ REFLEX TO CULTURE (INFECTION SUSPECTED) - Abnormal; Notable for the following components:   Color, Urine STRAW (*)    Specific Gravity, Urine 1.031 (*)    Glucose, UA >=500 (*)    Ketones, ur 5 (*)    All other components within normal limits  POC OCCULT BLOOD, ED - Abnormal; Notable for the following components:   Fecal Occult Bld POSITIVE (*)    All other components within normal limits  CULTURE, BLOOD (ROUTINE X 2)  CULTURE, BLOOD (ROUTINE X 2)  LIPASE, BLOOD  I-STAT CG4 LACTIC ACID, ED  I-STAT VENOUS BLOOD GAS, ED  TYPE AND SCREEN  ABO/RH  PREPARE RBC (CROSSMATCH)  TROPONIN I (HIGH SENSITIVITY)  TROPONIN I (HIGH SENSITIVITY)    EKG EKG Interpretation Date/Time:  Monday May 16 2023 13:21:51 EDT Ventricular Rate:  110 PR Interval:  156 QRS Duration:  100 QT Interval:  376 QTC Calculation: 508 R Axis:   41  Text Interpretation: Sinus tachycardia Incomplete right bundle branch block Borderline ECG HEART RATE INCREASED SINCE last EKG Confirmed by Elayne Snare ([id number]) on 05/16/2023 4:00:22 PM  Radiology CT ANGIO GI BLEED  Result Date: 05/16/2023 CLINICAL DATA:  Rectal bleeding, hypotension. EXAM: CTA ABDOMEN AND PELVIS WITHOUT AND WITH CONTRAST TECHNIQUE: Multidetector CT imaging of the abdomen and pelvis was performed using the standard protocol during bolus administration of intravenous contrast. Multiplanar reconstructed images and MIPs were obtained and reviewed to evaluate the vascular anatomy. Performed in conjunction with CTA of  the chest, reported separately. RADIATION DOSE REDUCTION: This exam was performed according to the departmental dose-optimization program which includes automated exposure control, adjustment of the mA and/or kV according to patient size and/or use of iterative reconstruction technique. CONTRAST:  OMNIPAQUE IOHEXOL 350 MG/ML SOLN COMPARISON:  Abdominal ultrasound 09/23/2022, MRI 10/05/2022 FINDINGS: VASCULAR Aorta: Normal caliber aorta without aneurysm, dissection, vasculitis or significant stenosis. Mild to moderate atherosclerosis. Celiac: Patent without evidence of aneurysm, dissection, vasculitis or significant stenosis. SMA: Patent without evidence of aneurysm, dissection, vasculitis or significant stenosis. Renals: Single bilateral renal arteries. Both renal arteries are patent without evidence of aneurysm, dissection, vasculitis, fibromuscular dysplasia or significant stenosis. IMA: Patent, distal branches poorly assessed due to phase of contrast. Inflow: Patent without evidence of aneurysm, dissection, vasculitis or significant stenosis. Moderate atherosclerosis. Proximal Outflow: Bilateral common femoral and visualized portions of the superficial and profunda femoral arteries are patent without evidence of aneurysm, dissection, vasculitis or significant stenosis. Veins: Venous phase imaging demonstrates sequela of portal hypertension with enlarged portal and splenic veins and numerous Paris off a gel varices. Additional varices in the anterior abdomen and left upper quadrant. Recannulized umbilical vein. Review of the MIP images confirms the above findings. NON-VASCULAR Lower chest: Assessed on concurrent chest CT, reported separately. Hepatobiliary: Cirrhotic hepatic morphology. No evidence of focal hepatic lesion. Gallstones within nondistended gallbladder. No biliary dilatation. Pancreas: The cystic pancreatic lesion on prior MRI is not confidently seen on the current exam. There is generalized  fatty atrophy. No ductal dilatation. No evidence of pancreatic inflammation. Spleen: Enlarged, 14.2 cm AP. No splenic infarct or focal abnormality. Adrenals/Urinary Tract: No adrenal nodule. Renal parapelvic cysts. No further follow-up imaging is recommended. No hydronephrosis or suspicious renal abnormality. Unremarkable urinary bladder. Stomach/Bowel: There is no contrast accumulation in  the GI tract to localize site of GI bleed. Ovoid hyperdensity within the sigmoid is present on precontrast exam and likely represent an ingested pill. Occasional areas of colonic wall thickening but no discrete pericolonic edema. There is mild wall thickening about the third portion of the duodenum with adjacent fat stranding, nonspecific. No additional small bowel inflammation. No obstruction. Periesophageal and perigastric varices. No associated contrast extravasation. Lymphatic: Prominent central mesenteric lymph nodes are likely reactive. Enlarged portal caval node measuring 18 mm short axis series 5, image 45. Additional prominent periportal and porta hepatis nodes. Reproductive: The prostate gland is enlarged spanning 5.5 cm and causes mass effect on the bladder base. Other: Generalized edema of the intra-abdominal mesenteric fat suggestive of third spacing. Trace upper abdominal ascites. No free intra-abdominal air. Musculoskeletal: Diffuse lumbar spondylosis. There are no acute or suspicious osseous abnormalities. IMPRESSION: VASCULAR 1. No contrast accumulation in the GI tract to localize site of GI bleed. 2. Sequela of portal hypertension with portosystemic collaterals. Paraesophageal and perigastric varices. 3.  Aortic Atherosclerosis (ICD10-I70.0). NON-VASCULAR 1. Cirrhosis.  No focal lesion. 2. Splenomegaly.  Trace upper abdominal ascites. 3. Prominent porta hepatis nodes likely reactive. 4. Nonspecific wall thickening of the third portion of the duodenum with adjacent fat stranding, possibility of duodenitis is  considered. 5. Enlarged prostate gland. 6. The cystic pancreatic lesion on prior MRI is not definitively seen. 7. Gallstones. Electronically Signed   By: Narda Rutherford M.D.   On: 05/16/2023 16:58   CT Angio Chest Pulmonary Embolism (PE) W or WO Contrast  Result Date: 05/16/2023 CLINICAL DATA:  High clinical suspicion for PE EXAM: CT ANGIOGRAPHY CHEST WITH CONTRAST TECHNIQUE: Multidetector CT imaging of the chest was performed using the standard protocol during bolus administration of intravenous contrast. Multiplanar CT image reconstructions and MIPs were obtained to evaluate the vascular anatomy. RADIATION DOSE REDUCTION: This exam was performed according to the departmental dose-optimization program which includes automated exposure control, adjustment of the mA and/or kV according to patient size and/or use of iterative reconstruction technique. CONTRAST:  OMNIPAQUE IOHEXOL 350 MG/ML SOLN COMPARISON:  Chest radiograph done today FINDINGS: Cardiovascular: There is homogeneous enhancement in thoracic aorta. There are no intraluminal filling defects in central pulmonary artery branches. Motion artifacts limit evaluation of small peripheral pulmonary artery branches. Coronary artery calcifications are seen. Mediastinum/Nodes: No significant abnormalities are seen. Lungs/Pleura: There is no focal pulmonary consolidation. There is no pleural effusion or pneumothorax. Upper Abdomen: There is nodularity of the liver surface suggesting possible cirrhosis. Minimal ascites is noted in upper abdomen adjacent to liver and spleen. Spleen appears to be enlarged in size. Spleen is not included in its entirety limiting evaluation of actual size. Small hiatal hernia is seen. There are ectatic vessels along the lesser curvature aspect of the stomach suggesting possible varices due to portal hypertension. There is recanalization of umbilical vein. Musculoskeletal: Degenerative changes are noted in cervical and thoracic  spine. Review of the MIP images confirms the above findings. IMPRESSION: There is no evidence of central pulmonary artery embolism. Evaluation of small peripheral pulmonary artery branches is limited by motion artifacts. There is no evidence of thoracic aortic dissection. Coronary artery calcifications are seen. There is no focal pulmonary consolidation. There is no pleural effusion or pneumothorax. Cirrhosis of liver. There are ectatic vessels along the lesser curvature aspect of stomach suggesting possible portal hypertension. Minimal ascites is seen in upper abdomen. Small hiatal hernia. Electronically Signed   By: Ernie Avena M.D.   On: 05/16/2023  16:50   DG Chest Portable 1 View  Result Date: 05/16/2023 CLINICAL DATA:  Hypotension, dizziness, diarrhea, and nausea EXAM: PORTABLE CHEST 1 VIEW COMPARISON:  Chest radiograph dated 03/22/2017 FINDINGS: Low lung volumes with bronchovascular crowding. No focal consolidations. No pleural effusion or pneumothorax. The heart size and mediastinal contours are within normal limits. No acute osseous abnormality. IMPRESSION: Low lung volumes with bronchovascular crowding. No focal consolidations. Electronically Signed   By: Agustin Cree M.D.   On: 05/16/2023 16:43    Procedures .Critical Care  Performed by: Linwood Dibbles, PA-C Authorized by: Linwood Dibbles, PA-C   Critical care provider statement:    Critical care time (minutes):  76   Critical care was necessary to treat or prevent imminent or life-threatening deterioration of the following conditions:  Circulatory failure, hepatic failure and shock   Critical care was time spent personally by me on the following activities:  Development of treatment plan with patient or surrogate, discussions with consultants, evaluation of patient's response to treatment, examination of patient, ordering and review of laboratory studies, ordering and review of radiographic studies, ordering and performing  treatments and interventions, pulse oximetry, re-evaluation of patient's condition and review of old charts     Medications Ordered in ED Medications  sodium chloride 0.9 % bolus 1,000 mL (has no administration in time range)    And  0.9 %  sodium chloride infusion (has no administration in time range)  pantoprozole (PROTONIX) 80 mg /NS 100 mL infusion (8 mg/hr Intravenous New Bag/Given 05/16/23 1736)  octreotide (SANDOSTATIN) 2 mcg/mL load via infusion 50 mcg (has no administration in time range)    And  octreotide (SANDOSTATIN) 500 mcg in sodium chloride 0.9 % 250 mL (2 mcg/mL) infusion (50 mcg/hr Intravenous New Bag/Given 05/16/23 1741)  ondansetron (ZOFRAN) injection 4 mg (has no administration in time range)  cefTRIAXone (ROCEPHIN) 1 g in sodium chloride 0.9 % 100 mL IVPB (has no administration in time range)  0.9 %  sodium chloride infusion (Manually program via Guardrails IV Fluids) (has no administration in time range)  sodium chloride 0.9 % bolus 1,000 mL (1,000 mLs Intravenous Not Given 05/16/23 1716)  cefTRIAXone (ROCEPHIN) 1 g in sodium chloride 0.9 % 100 mL IVPB (has no administration in time range)  albuterol (VENTOLIN HFA) 108 (90 Base) MCG/ACT inhaler 2 puff (has no administration in time range)  allopurinol (ZYLOPRIM) tablet 300 mg (has no administration in time range)  pantoprazole (PROTONIX) 80 mg /NS 100 mL IVPB (0 mg Intravenous Stopped 05/16/23 1729)  iohexol (OMNIPAQUE) 350 MG/ML injection 150 mL (150 mLs Intravenous Contrast Given 05/16/23 1633)  fentaNYL (SUBLIMAZE) injection 25 mcg (25 mcg Intravenous Given 05/16/23 1956)   ED Course/ Medical Decision Making/ A&P Clinical Course as of 05/16/23 2122  Mon May 16, 2023  1726 Discussed with Dr. Marca Ancona with [facility].  Recommends NPO.  Plan for endoscopy tomorrow, if persistently hypotensive, change in status, reconsult [BH]    Clinical Course User Index [BH] ,  A, PA-C   62 year old here for evaluation of  hypotension and black stool.  Been feeling unwell over the last few days.  Noted greater than 15 episodes of watery black stool since this morning.  History of ulcerative colitis.  Followed by [facility].  Apparently had MRI which I reviewed which did show severe portal hypertension, cirrhosis, ascites, gastroesophageal varices.  He does admit to drinking alcohol, 10 alcoholic beverages weekly however denies any DTs, withdrawal seizures.  No emesis.  Has  some diffuse epigastric tenderness on exam, ascites.  He appears very pale.  On arrival patient hypotensive, tachycardic.  Plan on labs, imaging.  Discussed with nursing to IVs, IV fluids, emergent transfusion which patient is agreeable for.  Per imaging will be started on Rocephin, octreotide, Protonix, IV fluids and imaging.  Delay in care from waiting room due to boarding issues in the emergency department.  This roomed immediately saw patient in room.  Labs and imaging personally viewed and interpreted:  CBC without leukocytosis, hemoglobin 5.8, down from baseline at 12 Metabolic panel shows glucose 492, elevated BUN at 38, T. bili 1.4 Troponin 6 EKG without ischemic changes  Discussed with nursing patient emergently taken to the CT scanner to assess for cause of his hypotension.  Occult with black stool, positive  BP improved while laying flat, blood pressure 114 however upon sitting up blood pressures drop into the 90s systolic.  CT scans pending.  Will discuss with GI  CT scans show varices, duodenitis, no active GI bleed, PE, infectious process  Blood pressures improved, 117 systolic however still tachycardic.  Page out to GI pending.  Discussed with gastroenterologist, Dr. Marca Ancona who rec will see patient in consult tomorrow.  Agrees with the ocreotide, Protonix, Rocephin.  N.p.o., plan for endoscopy tomorrow, if patient has persistent hypotension will need reconsult  Nursing stated patient starting and anxious.  I reassessed at bedside.   He states he has pain in our beds are uncomfortable.  His blood pressures have improved, most recent 120 systolic.  Will give a dose of fentanyl.  He is still getting blood as well as IV fluids.  Discussed with Dr. Sharon Seller with hospitalist.  Will admit for further management and workup.  The patient appears reasonably stabilized for admission considering the current resources, flow, and capabilities available in the ED at this time, and I doubt any other [facility] requiring further screening and/or treatment in the ED prior to admission.                                    Medical Decision Making Amount and/or Complexity of Data Reviewed Independent Historian: spouse External Data Reviewed: labs, radiology, ECG and notes. Labs: ordered. Decision-making details documented in ED Course. Radiology: ordered and independent interpretation performed. Decision-making details documented in ED Course. ECG/medicine tests: ordered and independent interpretation performed. Decision-making details documented in ED Course.  Risk OTC drugs. Prescription drug management. Parenteral controlled substances. Decision regarding hospitalization. Diagnosis or treatment significantly limited by social determinants of health.           Final Clinical Impression(s) / ED Diagnoses Final diagnoses:  Gastrointestinal hemorrhage with melena  Hypotension, unspecified hypotension type  Idiopathic esophageal varices with bleeding (HCC)  Duodenitis  Hyperglycemia  Symptomatic anemia    Rx / DC Orders ED Discharge Orders     None         ,  A, PA-C 05/16/23 2006    Elayne Snare K, DO 05/16/23 2355

## 2023-05-16 NOTE — ED Notes (Signed)
ED TO INPATIENT HANDOFF REPORT  ED Nurse Name and Phone #:  [phone]  S Name/Age/Gender Darren [name] 62 y.o. male Room/Bed: 030C/030C  Code Status   Code Status: Full Code  Home/SNF/Other Home Patient oriented to: self, place, time, and situation Is this baseline? Yes   Triage Complete: Triage complete  Chief Complaint Acute GI bleeding [K92.2]  Triage Note Pt c/o hypotension 91 systolic while lying. Pt c/o dizziness, diarrhea and nauseated. Pt c/o black stools since midnight. Pt denies blood thinners   Allergies No Known Allergies  Level of Care/Admitting Diagnosis ED Disposition     ED Disposition  Admit   Condition  --   Comment  Hospital Area: [facility] [[id number]]  Level of Care: Progressive [102]  Admit to Progressive based on following criteria: GI, ENDOCRINE disease patients with GI bleeding, acute liver failure or pancreatitis, stable with diabetic ketoacidosis or thyrotoxicosis (hypothyroid) state.  May admit patient to [facility] or [facility] if equivalent level of care is available:: No  Covid Evaluation: Asymptomatic - no recent exposure (last 10 days) testing not required  Diagnosis: Acute GI bleeding [829562]  Admitting Physician: Sharon Seller, JEFFREY T [[id number]]  Attending Physician: Sharon Seller, JEFFREY T [[id number]]  Certification:: I certify this patient will need inpatient services for at least 2 midnights  Estimated Length of Stay: 3          B Medical/Surgery History Past Medical History:  Diagnosis Date   Allergy    Asthma    Gout    Ulcerative colitis (HCC)    followed by [facility]   Varicose veins    Past Surgical History:  Procedure Laterality Date   HERNIA REPAIR       A IV Location/Drains/Wounds Patient Lines/Drains/Airways Status     Active Line/Drains/Airways     Name Placement date Placement time Site Days   Peripheral IV 05/16/23 20 G Anterior;Distal;Right;Upper Arm 05/16/23  1634  Arm  less than 1    Peripheral IV 05/16/23 20 G Anterior;Left Antecubital 05/16/23  1700  Antecubital  less than 1            Intake/Output Last 24 hours  Intake/Output Summary (Last 24 hours) at 05/16/2023 2053 Last data filed at 05/16/2023 2046 Gross per 24 hour  Intake 310.57 ml  Output --  Net 310.57 ml    Labs/Imaging Results for orders placed or performed during the hospital encounter of 05/16/23 (from the past 48 hour(s))  Comprehensive metabolic panel     Status: Abnormal   Collection Time: 05/16/23  1:07 PM  Result Value Ref Range   Sodium 134 (L) 135 - 145 mmol/L   Potassium 5.0 3.5 - 5.1 mmol/L   Chloride 101 98 - 111 mmol/L   CO2 20 (L) 22 - 32 mmol/L   Glucose, Bld 492 (H) 70 - 99 mg/dL    Comment: Glucose reference range applies only to samples taken after fasting for at least 8 hours.   BUN 38 (H) 8 - 23 mg/dL   Creatinine, Ser 1.30 0.61 - 1.24 mg/dL   Calcium 9.0 8.9 - 86.5 mg/dL   Total Protein 5.0 (L) 6.5 - 8.1 g/dL   Albumin 2.9 (L) 3.5 - 5.0 g/dL   AST 29 15 - 41 U/L   ALT 29 0 - 44 U/L   Alkaline Phosphatase 55 38 - 126 U/L   Total Bilirubin 1.4 (H) 0.3 - 1.2 mg/dL   GFR, Estimated >78 >46 mL/min    Comment: (  NOTE) Calculated using the CKD-EPI Creatinine Equation (2021)    Anion gap 13 5 - 15    Comment: Performed at [facility], [address] [place], [place] [address]  CBC     Status: Abnormal   Collection Time: 05/16/23  1:07 PM  Result Value Ref Range   WBC 11.7 (H) 4.0 - 10.5 K/uL   RBC 3.46 (L) 4.22 - 5.81 MIL/uL   Hemoglobin 5.8 (LL) 13.0 - 17.0 g/dL    Comment: REPEATED TO VERIFY Reticulocyte Hemoglobin testing may be clinically indicated, consider ordering this additional test [id number] THIS CRITICAL RESULT HAS VERIFIED AND BEEN CALLED TO ALANA BANKS,RN BY ZELDA BEECH ON 08 05 2024 AT 1436, AND HAS BEEN READ BACK.     HCT 22.0 (L) 39.0 - 52.0 %   MCV 63.6 (L) 80.0 - 100.0 fL   MCH 16.8 (L) 26.0 - 34.0 pg   MCHC 26.4 (L) 30.0 - 36.0 g/dL    RDW 57.8 (H) 46.9 - 15.5 %   Platelets 188 150 - 400 K/uL    Comment: REPEATED TO VERIFY   nRBC 0.0 0.0 - 0.2 %    Comment: Performed at [facility], [address] [place], [place] [address]  Type and screen [facility]     Status: None (Preliminary result)   Collection Time: 05/16/23  1:07 PM  Result Value Ref Range   ABO/RH(D) A POS    Antibody Screen NEG    Sample Expiration 05/19/2023,2359    Unit Number [id number]    Blood Component Type RED CELLS,LR    Unit division 00    Status of Unit ISSUED    Unit tag comment VERBAL ORDERS PER DR Theresia Lo    Transfusion Status OK TO TRANSFUSE    Crossmatch Result      COMPATIBLE Performed at [facility], [address], [place], [place] [address]    Unit Number [id number]    Blood Component Type RED CELLS,LR    Unit division 00    Status of Unit ISSUED    Unit tag comment VERBAL ORDERS PER DR Theresia Lo    Transfusion Status OK TO TRANSFUSE    Crossmatch Result COMPATIBLE   Troponin I (High Sensitivity)     Status: None   Collection Time: 05/16/23  1:07 PM  Result Value Ref Range   Troponin I (High Sensitivity) 6 <18 ng/L    Comment: (NOTE) Elevated high sensitivity troponin I (hsTnI) values and significant  changes across serial measurements may suggest ACS but many other  chronic and acute conditions are known to elevate hsTnI results.  Refer to the "Links" section for chest pain algorithms and additional  guidance. Performed at [facility], [address] [place], [place] [address]   Prepare RBC     Status: None   Collection Time: 05/16/23  3:50 PM  Result Value Ref Range   Order Confirmation      ORDER PROCESSED BY BLOOD BANK Performed at [facility], [address] [place], [place] [address]   ABO/Rh     Status: None   Collection Time: 05/16/23  4:00 PM  Result Value Ref Range   ABO/RH(D)      A POS Performed at [facility], [address]  [place], [place] [address]   Troponin I (High Sensitivity)     Status: None   Collection Time: 05/16/23  4:00 PM  Result Value Ref  Range   Troponin I (High Sensitivity) 6 <18 ng/L    Comment: (NOTE) Elevated high sensitivity troponin I (hsTnI) values and significant  changes across serial measurements may suggest ACS but many other  chronic and acute conditions are known to elevate hsTnI results.  Refer to the "Links" section for chest pain algorithms and additional  guidance. Performed at [facility], [address] [place], [place] [address]   Protime-INR     Status: Abnormal   Collection Time: 05/16/23  4:00 PM  Result Value Ref Range   Prothrombin Time 17.7 (H) 11.4 - 15.2 seconds   INR 1.4 (H) 0.8 - 1.2    Comment: (NOTE) INR goal varies based on device and disease states. Performed at [facility], [address], [place], [place] [address]   Lipase, blood     Status: None   Collection Time: 05/16/23  4:00 PM  Result Value Ref Range   Lipase 40 11 - 51 U/L    Comment: Performed at [facility], [address], [place], [place] [address]  POC occult blood, ED     Status: Abnormal   Collection Time: 05/16/23  4:47 PM  Result Value Ref Range   Fecal Occult Bld POSITIVE (A) NEGATIVE  Urinalysis, w/ Reflex to Culture (Infection Suspected) -Urine, Clean Catch     Status: Abnormal   Collection Time: 05/16/23  5:14 PM  Result Value Ref Range   Specimen Source URINE, CLEAN CATCH    Color, Urine STRAW (A) YELLOW   APPearance CLEAR CLEAR   Specific Gravity, Urine 1.031 (H) 1.005 - 1.030   pH 5.0 5.0 - 8.0   Glucose, UA >=500 (A) NEGATIVE mg/dL   Hgb urine dipstick NEGATIVE NEGATIVE   Bilirubin Urine NEGATIVE NEGATIVE   Ketones, ur 5 (A) NEGATIVE mg/dL   Protein, ur NEGATIVE NEGATIVE mg/dL   Nitrite NEGATIVE NEGATIVE   Leukocytes,Ua NEGATIVE NEGATIVE   RBC / HPF 0-5 0 - 5 RBC/hpf   WBC, UA 0-5 0 - 5 WBC/hpf    Comment:        Reflex urine culture not  performed if WBC <=10, OR if Squamous epithelial cells >5. If Squamous epithelial cells >5 suggest recollection.    Bacteria, UA NONE SEEN NONE SEEN   Squamous Epithelial / HPF 0-5 0 - 5 /HPF    Comment: Performed at [facility], [address], [place], [place] [address]   CT ANGIO GI BLEED  Result Date: 05/16/2023 CLINICAL DATA:  Rectal bleeding, hypotension. EXAM: CTA ABDOMEN AND PELVIS WITHOUT AND WITH CONTRAST TECHNIQUE: Multidetector CT imaging of the abdomen and pelvis was performed using the standard protocol during bolus administration of intravenous contrast. Multiplanar reconstructed images and MIPs were obtained and reviewed to evaluate the vascular anatomy. Performed in conjunction with CTA of the chest, reported separately. RADIATION DOSE REDUCTION: This exam was performed according to the departmental dose-optimization program which includes automated exposure control, adjustment of the mA and/or kV according to patient size and/or use of iterative reconstruction technique. CONTRAST:  OMNIPAQUE IOHEXOL 350 MG/ML SOLN COMPARISON:  Abdominal ultrasound 09/23/2022, MRI 10/05/2022 FINDINGS: VASCULAR Aorta: Normal caliber aorta without aneurysm, dissection, vasculitis or significant stenosis. Mild to moderate atherosclerosis. Celiac: Patent without evidence of aneurysm, dissection, vasculitis or significant stenosis. SMA: Patent without evidence of aneurysm, dissection, vasculitis or significant stenosis. Renals: Single bilateral renal arteries. Both renal arteries are patent without evidence of aneurysm, dissection, vasculitis, fibromuscular dysplasia or significant  stenosis. IMA: Patent, distal branches poorly assessed due to phase of contrast. Inflow: Patent without evidence of aneurysm, dissection, vasculitis or significant stenosis. Moderate atherosclerosis. Proximal Outflow: Bilateral common femoral and visualized portions of the superficial and profunda femoral arteries are  patent without evidence of aneurysm, dissection, vasculitis or significant stenosis. Veins: Venous phase imaging demonstrates sequela of portal hypertension with enlarged portal and splenic veins and numerous Paris off a gel varices. Additional varices in the anterior abdomen and left upper quadrant. Recannulized umbilical vein. Review of the MIP images confirms the above findings. NON-VASCULAR Lower chest: Assessed on concurrent chest CT, reported separately. Hepatobiliary: Cirrhotic hepatic morphology. No evidence of focal hepatic lesion. Gallstones within nondistended gallbladder. No biliary dilatation. Pancreas: The cystic pancreatic lesion on prior MRI is not confidently seen on the current exam. There is generalized fatty atrophy. No ductal dilatation. No evidence of pancreatic inflammation. Spleen: Enlarged, 14.2 cm AP. No splenic infarct or focal abnormality. Adrenals/Urinary Tract: No adrenal nodule. Renal parapelvic cysts. No further follow-up imaging is recommended. No hydronephrosis or suspicious renal abnormality. Unremarkable urinary bladder. Stomach/Bowel: There is no contrast accumulation in the GI tract to localize site of GI bleed. Ovoid hyperdensity within the sigmoid is present on precontrast exam and likely represent an ingested pill. Occasional areas of colonic wall thickening but no discrete pericolonic edema. There is mild wall thickening about the third portion of the duodenum with adjacent fat stranding, nonspecific. No additional small bowel inflammation. No obstruction. Periesophageal and perigastric varices. No associated contrast extravasation. Lymphatic: Prominent central mesenteric lymph nodes are likely reactive. Enlarged portal caval node measuring 18 mm short axis series 5, image 45. Additional prominent periportal and porta hepatis nodes. Reproductive: The prostate gland is enlarged spanning 5.5 cm and causes mass effect on the bladder base. Other: Generalized edema of the  intra-abdominal mesenteric fat suggestive of third spacing. Trace upper abdominal ascites. No free intra-abdominal air. Musculoskeletal: Diffuse lumbar spondylosis. There are no acute or suspicious osseous abnormalities. IMPRESSION: VASCULAR 1. No contrast accumulation in the GI tract to localize site of GI bleed. 2. Sequela of portal hypertension with portosystemic collaterals. Paraesophageal and perigastric varices. 3.  Aortic Atherosclerosis (ICD10-I70.0). NON-VASCULAR 1. Cirrhosis.  No focal lesion. 2. Splenomegaly.  Trace upper abdominal ascites. 3. Prominent porta hepatis nodes likely reactive. 4. Nonspecific wall thickening of the third portion of the duodenum with adjacent fat stranding, possibility of duodenitis is considered. 5. Enlarged prostate gland. 6. The cystic pancreatic lesion on prior MRI is not definitively seen. 7. Gallstones. Electronically Signed   By: Narda Rutherford M.D.   On: 05/16/2023 16:58   CT Angio Chest Pulmonary Embolism (PE) W or WO Contrast  Result Date: 05/16/2023 CLINICAL DATA:  High clinical suspicion for PE EXAM: CT ANGIOGRAPHY CHEST WITH CONTRAST TECHNIQUE: Multidetector CT imaging of the chest was performed using the standard protocol during bolus administration of intravenous contrast. Multiplanar CT image reconstructions and MIPs were obtained to evaluate the vascular anatomy. RADIATION DOSE REDUCTION: This exam was performed according to the departmental dose-optimization program which includes automated exposure control, adjustment of the mA and/or kV according to patient size and/or use of iterative reconstruction technique. CONTRAST:  OMNIPAQUE IOHEXOL 350 MG/ML SOLN COMPARISON:  Chest radiograph done today FINDINGS: Cardiovascular: There is homogeneous enhancement in thoracic aorta. There are no intraluminal filling defects in central pulmonary artery branches. Motion artifacts limit evaluation of small peripheral pulmonary artery branches. Coronary artery  calcifications are seen. Mediastinum/Nodes: No significant abnormalities are seen. Lungs/Pleura: There  is no focal pulmonary consolidation. There is no pleural effusion or pneumothorax. Upper Abdomen: There is nodularity of the liver surface suggesting possible cirrhosis. Minimal ascites is noted in upper abdomen adjacent to liver and spleen. Spleen appears to be enlarged in size. Spleen is not included in its entirety limiting evaluation of actual size. Small hiatal hernia is seen. There are ectatic vessels along the lesser curvature aspect of the stomach suggesting possible varices due to portal hypertension. There is recanalization of umbilical vein. Musculoskeletal: Degenerative changes are noted in cervical and thoracic spine. Review of the MIP images confirms the above findings. IMPRESSION: There is no evidence of central pulmonary artery embolism. Evaluation of small peripheral pulmonary artery branches is limited by motion artifacts. There is no evidence of thoracic aortic dissection. Coronary artery calcifications are seen. There is no focal pulmonary consolidation. There is no pleural effusion or pneumothorax. Cirrhosis of liver. There are ectatic vessels along the lesser curvature aspect of stomach suggesting possible portal hypertension. Minimal ascites is seen in upper abdomen. Small hiatal hernia. Electronically Signed   By: Ernie Avena M.D.   On: 05/16/2023 16:50   DG Chest Portable 1 View  Result Date: 05/16/2023 CLINICAL DATA:  Hypotension, dizziness, diarrhea, and nausea EXAM: PORTABLE CHEST 1 VIEW COMPARISON:  Chest radiograph dated 03/22/2017 FINDINGS: Low lung volumes with bronchovascular crowding. No focal consolidations. No pleural effusion or pneumothorax. The heart size and mediastinal contours are within normal limits. No acute osseous abnormality. IMPRESSION: Low lung volumes with bronchovascular crowding. No focal consolidations. Electronically Signed   By: Agustin Cree M.D.   On:  05/16/2023 16:43    Pending Labs Unresulted Labs (From admission, onward)     Start     Ordered   05/17/23 0500  HIV Antibody (routine testing w rflx)  (HIV Antibody (Routine testing w reflex) panel)  Tomorrow morning,   R        05/16/23 2013   05/17/23 0500  Comprehensive metabolic panel  Tomorrow morning,   R        05/16/23 2016   05/17/23 0500  CBC  Tomorrow morning,   R        05/16/23 2016   05/17/23 0500  Hemoglobin A1c  Tomorrow morning,   R        05/16/23 2016            Vitals/Pain Today's Vitals   05/16/23 2015 05/16/23 2044 05/16/23 2046 05/16/23 2046  BP: (!) 81/48 (!) 110/43    Pulse: (!) 103 91    Resp: 18 18    Temp:  98.1 F (36.7 C)    TempSrc:  Oral    SpO2: 100% 100% 100%   Weight:      Height:      PainSc:    Asleep    Isolation Precautions No active isolations  Medications Medications  sodium chloride 0.9 % bolus 1,000 mL (1,000 mLs Intravenous New Bag/Given 05/16/23 2022)    And  0.9 %  sodium chloride infusion (has no administration in time range)  pantoprozole (PROTONIX) 80 mg /NS 100 mL infusion (8 mg/hr Intravenous New Bag/Given 05/16/23 1736)  octreotide (SANDOSTATIN) 2 mcg/mL load via infusion 50 mcg (has no administration in time range)    And  octreotide (SANDOSTATIN) 500 mcg in sodium chloride 0.9 % 250 mL (2 mcg/mL) infusion (50 mcg/hr Intravenous New Bag/Given 05/16/23 1741)  ondansetron (ZOFRAN) injection 4 mg (has no administration in time range)  cefTRIAXone (ROCEPHIN) 1 g  in sodium chloride 0.9 % 100 mL IVPB (has no administration in time range)  0.9 %  sodium chloride infusion (Manually program via Guardrails IV Fluids) (has no administration in time range)  sodium chloride 0.9 % bolus 1,000 mL (1,000 mLs Intravenous Not Given 05/16/23 1716)  cefTRIAXone (ROCEPHIN) 1 g in sodium chloride 0.9 % 100 mL IVPB (has no administration in time range)  albuterol (PROVENTIL) (2.5 MG/3ML) 0.083% nebulizer solution 3 mL (has no administration  in time range)  allopurinol (ZYLOPRIM) tablet 300 mg (has no administration in time range)  acetaminophen (TYLENOL) tablet 500 mg (has no administration in time range)  ondansetron (ZOFRAN) tablet 4 mg (has no administration in time range)    Or  ondansetron (ZOFRAN) injection 4 mg (has no administration in time range)  thiamine (VITAMIN B1) injection 100 mg (has no administration in time range)  phytonadione (VITAMIN K) tablet 10 mg (has no administration in time range)  sodium chloride 0.9 % bolus 1,000 mL (has no administration in time range)  pantoprazole (PROTONIX) 80 mg /NS 100 mL IVPB (0 mg Intravenous Stopped 05/16/23 1729)  iohexol (OMNIPAQUE) 350 MG/ML injection 150 mL (150 mLs Intravenous Contrast Given 05/16/23 1633)  fentaNYL (SUBLIMAZE) injection 25 mcg (25 mcg Intravenous Given 05/16/23 1956)    Mobility non-ambulatory     Focused Assessments Cardiac Assessment Handoff:  Cardiac Rhythm: Sinus tachycardia No results found for: "CKTOTAL", "CKMB", "CKMBINDEX", "TROPONINI" No results found for: "DDIMER" Does the Patient currently have chest pain? No    R Recommendations: See Admitting Provider Note  Report given to:   Additional Notes:

## 2023-05-16 NOTE — H&P (Addendum)
ADMISSION HISTORY AND PHYSICAL  Darren [name] WUJ:[id number] DOB: 1961-05-18 DOA: 05/16/2023  PCP: Patient, No Pcp Per Patient coming from: home via [facility] ED  Chief Complaint: melena and hypotension   HPI:  62 year old with a history of ulcerative colitis and chronic Fe deficiency anemai who has not been compliant with his last few doses of IV Fe tx who presented to the ER after he had been feeling unwell for the last several days to weeks.  He describes many weeks of worsening lethargy with decreased exertional tolerance becoming much worse in the last few days. He reported he was having regular bowel movements even as early as yesterday morning, but then around midday yesterday experienced the acute onset of pitch black jellylike melena with over 12 occurring subsequently over the last 24hrs.  He found himself to be acutely more weak and orthostatic prompting his presentation to the ER.  He reported some diffuse epigastric tenderness but no nausea or vomiting.  In the ER he was confirmed to be hypotensive and was tachycardic.  His hemoglobin was 5.8 from a baseline of 12.7 this time last year, but with more recent CBC not available in our system.  BUN was 38.  Stool was guaiac positive.  CTa of the abdomen noted varices cirrhosis and duodenitis but no active GI bleeding.  Protonix and octreotide drips were initiated with and orders were written to trend diffuse the patient 2 units PRBC.  The ER consulted [facility] who reported they would see the patient in the morning.  Assessment/Plan  Acute GI bleed with hypotension -suspect variceal source Continue volume resuscitation, blood transfusion, octreotide drip, and Protonix drip -will allow small-volume clear liquid for now but full n.p.o. after midnight as patient will require endoscopic evaluation and possible therapeutic intervention - he is keeping his MAP in the 62-82 range at present with no clinical evidence of organ damage/clinical shock, so I  feel he is ok for progressive care at this time - should his MAP decline or his tachycardia not improve with his ongoing volume resuscitation and blood transfusions, he may need to be upgraded to ICU status   Acute blood loss anemia on chronic iron deficiency anemia  Baseline hemoglobin appears to be 12, though his most recent may have been lower due to his noncompliance with iron replacement therapy -transfuse with goal hemoglobin of 7.0 and hemodynamic stability  Ulcerative colitis 30+ year history of this followed by [facility] -reports that this is usually very well-controlled and that he has not experienced an acute flare in many many years  Cirrhosis of the liver with stigmata of portal hypertension /known varices Suspect this is related to excess alcohol intake and hepatic steatosis - [facility] records are not available to me on epic -will await GI consultation to reveal extent of prior workup prior to ordering further testing  Cystic pancreatic lesions Extent of outpatient workup not clear as GI office notes not available to me but it does appear that these were felt to be pancreatic pseudocysts and not likely malignant -await further comment by GI consultation team  Gout Appears quiescent for now -continue allopurinol as able  Obesity - Body mass index is 36.51 kg/m.   DVT prophylaxis: SCDs Code Status:   Code Status: Not on file Family Communication: Spoke with wife at bedside Disposition Plan:  Admit to Inpatient   Review of Systems: As per HPI otherwise 10 point review of systems negative.   Past Medical History:  Diagnosis Date  Allergy    Asthma    Gout    Ulcerative colitis (HCC)    followed by Buccini   Varicose veins     Past Surgical History:  Procedure Laterality Date   HERNIA REPAIR      Family History  Family History  Problem Relation Age of Onset   Cancer Father        colon cancer    Social History   reports that he has never smoked. He has  never used smokeless tobacco. He reports current alcohol use. He reports that he does not use drugs.  Allergies No Known Allergies  Prior to Admission medications   Medication Sig Start Date End Date Taking? Authorizing Provider  albuterol (PROVENTIL HFA;VENTOLIN HFA) 108 (90 Base) MCG/ACT inhaler Inhale 2 puffs into the lungs every 6 (six) hours as needed. 03/22/17  Yes McVey, Madelaine Bhat, PA-C  allopurinol (ZYLOPRIM) 300 MG tablet Take 1 tablet (300 mg total) by mouth daily. 06/07/18 05/16/23 Yes Sagardia, Eilleen Kempf, MD  ASPIRIN PO Take 2 tablets by mouth as needed (Takes as needed for pain in his knees).   Yes [provider]  Mesalamine 800 MG TBEC Take 1 tablet by mouth daily. 01/14/14  Yes Copland, Gwenlyn Found, MD  valsartan (DIOVAN) 160 MG tablet Take 160 mg by mouth daily.   Yes [provider]    Physical Exam: Vitals:   05/16/23 1552 05/16/23 1627 05/16/23 1652 05/16/23 1700  BP: (!) 94/55 127/72 (!) 117/57 114/61  Pulse: (!) 110 (!) 113 (!) 111 (!) 112  Resp: (!) 24 (!) 22 (!) 98 (!) 22  Temp:  98.5 F (36.9 C) 98 F (36.7 C)   TempSrc:  Oral Oral   SpO2: 100% 100% 96% 97%  Weight:      Height:        Constitutional: NAD, calm, comfortable -alert but pale ENMT: Mucous membranes are dry. Normal dentition.  Respiratory: clear to auscultation bilaterally, no wheezing, no crackles. Normal respiratory effort. No accessory muscle use.  Cardiovascular: Tachycardic but regular with no appreciable murmur or rub Abdomen: Protuberant.  No tenderness or masses to palpation. No hepatosplenomegaly. Bowel sounds positive. Soft.  No focal masses. Musculoskeletal: No clubbing / cyanosis. No joint deformity upper and lower extremities. No contractures. Normal muscle tone.  Skin: No rashes, lesions, ulcers.  Neurologic: CN 2-12 grossly intact B. Sensation intact. Strength 5/5 in all 4 extremities.    Labs on Admission:   CBC: Recent Labs  Lab 05/16/23 1307  WBC  11.7*  HGB 5.8*  HCT 22.0*  MCV 63.6*  PLT 188   Basic Metabolic Panel: Recent Labs  Lab 05/16/23 1307  NA 134*  K 5.0  CL 101  CO2 20*  GLUCOSE 492*  BUN 38*  CREATININE 0.90  CALCIUM 9.0   GFR: Estimated Creatinine Clearance: 111.9 mL/min (by C-G formula based on SCr of 0.9 mg/dL).  Liver Function Tests: Recent Labs  Lab 05/16/23 1307  AST 29  ALT 29  ALKPHOS 55  BILITOT 1.4*  PROT 5.0*  ALBUMIN 2.9*   Recent Labs  Lab 05/16/23 1600  LIPASE 40   Coagulation Profile: Recent Labs  Lab 05/16/23 1600  INR 1.4*   Radiological Exams on Admission: CT ANGIO GI BLEED  Result Date: 05/16/2023 IMPRESSION: VASCULAR 1. No contrast accumulation in the GI tract to localize site of GI bleed. 2. Sequela of portal hypertension with portosystemic collaterals. Paraesophageal and perigastric varices. 3.  Aortic Atherosclerosis (ICD10-I70.0). NON-VASCULAR 1.  Cirrhosis.  No focal lesion. 2. Splenomegaly.  Trace upper abdominal ascites. 3. Prominent porta hepatis nodes likely reactive. 4. Nonspecific wall thickening of the third portion of the duodenum with adjacent fat stranding, possibility of duodenitis is considered. 5. Enlarged prostate gland. 6. The cystic pancreatic lesion on prior MRI is not definitively seen. 7. Gallstones. Electronically Signed   By: Narda Rutherford M.D.   On: 05/16/2023 16:58   CT Angio Chest Pulmonary Embolism (PE) W or WO Contrast  Result Date: 05/16/2023 IMPRESSION: There is no evidence of central pulmonary artery embolism. Evaluation of small peripheral pulmonary artery branches is limited by motion artifacts. There is no evidence of thoracic aortic dissection. Coronary artery calcifications are seen. There is no focal pulmonary consolidation. There is no pleural effusion or pneumothorax. Cirrhosis of liver. There are ectatic vessels along the lesser curvature aspect of stomach suggesting possible portal hypertension. Minimal ascites is seen in upper  abdomen. Small hiatal hernia. Electronically Signed   By: Ernie Avena M.D.   On: 05/16/2023 16:50   DG Chest Portable 1 View  Result Date: 05/16/2023 IMPRESSION: Low lung volumes with bronchovascular crowding. No focal consolidations. Electronically Signed   By: Agustin Cree M.D.   On: 05/16/2023 16:43     Lonia Blood, MD [facility] Office  [phone] Pager - Text Page per Amion as per below:  On-Call/Text Page:      [email or web address]  If 7PM-7AM, please contact night-coverage [email or web address] 05/16/2023, 7:06 PM

## 2023-05-16 NOTE — ED Notes (Signed)
PT was not available at time to give rocephin/he was in ct and immediately rushed back to give emergency blood and other meds.

## 2023-05-17 ENCOUNTER — Encounter (HOSPITAL_COMMUNITY): Payer: Self-pay | Admitting: Internal Medicine

## 2023-05-17 ENCOUNTER — Encounter (HOSPITAL_COMMUNITY): Payer: Self-pay | Admitting: Critical Care Medicine

## 2023-05-17 ENCOUNTER — Encounter (HOSPITAL_COMMUNITY)
Admission: EM | Disposition: A | Discharge status: Home / Self Care | Payer: Self-pay | Source: Home / Self Care | Attending: Critical Care Medicine

## 2023-05-17 ENCOUNTER — Inpatient Hospital Stay (HOSPITAL_COMMUNITY): Payer: BC Managed Care – PPO

## 2023-05-17 DIAGNOSIS — K922 Gastrointestinal hemorrhage, unspecified: Secondary | ICD-10-CM | POA: Diagnosis not present

## 2023-05-17 DIAGNOSIS — K921 Melena: Secondary | ICD-10-CM | POA: Diagnosis not present

## 2023-05-17 DIAGNOSIS — I8501 Esophageal varices with bleeding: Secondary | ICD-10-CM | POA: Diagnosis not present

## 2023-05-17 DIAGNOSIS — R739 Hyperglycemia, unspecified: Secondary | ICD-10-CM

## 2023-05-17 DIAGNOSIS — R578 Other shock: Secondary | ICD-10-CM

## 2023-05-17 DIAGNOSIS — I959 Hypotension, unspecified: Secondary | ICD-10-CM

## 2023-05-17 HISTORY — PX: ESOPHAGEAL BANDING: SHX5518

## 2023-05-17 HISTORY — PX: ESOPHAGOGASTRODUODENOSCOPY (EGD) WITH PROPOFOL: SHX5813

## 2023-05-17 LAB — CBC
HCT: 21 % — ABNORMAL LOW (ref 39.0–52.0)
HCT: 21.1 % — ABNORMAL LOW (ref 39.0–52.0)
HCT: 23.5 % — ABNORMAL LOW (ref 39.0–52.0)
Hemoglobin: 6.1 g/dL — CL (ref 13.0–17.0)
Hemoglobin: 5.9 g/dL — CL (ref 13.0–17.0)
Hemoglobin: 6.9 g/dL — CL (ref 13.0–17.0)
MCH: 19.2 pg — ABNORMAL LOW (ref 26.0–34.0)
MCH: 19.5 pg — ABNORMAL LOW (ref 26.0–34.0)
MCH: 21 pg — ABNORMAL LOW (ref 26.0–34.0)
MCHC: 29 g/dL — ABNORMAL LOW (ref 30.0–36.0)
MCHC: 28 g/dL — ABNORMAL LOW (ref 30.0–36.0)
MCHC: 29.4 g/dL — ABNORMAL LOW (ref 30.0–36.0)
MCV: 66.2 fL — ABNORMAL LOW (ref 80.0–100.0)
MCV: 69.6 fL — ABNORMAL LOW (ref 80.0–100.0)
MCV: 71.4 fL — ABNORMAL LOW (ref 80.0–100.0)
Platelets: 170 10*3/uL (ref 150–400)
Platelets: 117 10*3/uL — ABNORMAL LOW (ref 150–400)
Platelets: 108 10*3/uL — ABNORMAL LOW (ref 150–400)
RBC: 3.17 MIL/uL — ABNORMAL LOW (ref 4.22–5.81)
RBC: 3.03 MIL/uL — ABNORMAL LOW (ref 4.22–5.81)
RBC: 3.29 MIL/uL — ABNORMAL LOW (ref 4.22–5.81)
RDW: 23.9 % — ABNORMAL HIGH (ref 11.5–15.5)
RDW: 25.5 % — ABNORMAL HIGH (ref 11.5–15.5)
RDW: 25.3 % — ABNORMAL HIGH (ref 11.5–15.5)
WBC: 23.6 10*3/uL — ABNORMAL HIGH (ref 4.0–10.5)
WBC: 17.8 10*3/uL — ABNORMAL HIGH (ref 4.0–10.5)
WBC: 12.2 10*3/uL — ABNORMAL HIGH (ref 4.0–10.5)
nRBC: 0 % (ref 0.0–0.2)
nRBC: 0 % (ref 0.0–0.2)
nRBC: 0 % (ref 0.0–0.2)

## 2023-05-17 LAB — PREPARE FRESH FROZEN PLASMA

## 2023-05-17 LAB — RESPIRATORY PANEL BY PCR

## 2023-05-17 LAB — BPAM PLATELET PHERESIS
Blood Product Expiration Date: 202408082359
ISSUE DATE / TIME: 202408062225
Unit Type and Rh: 6200

## 2023-05-17 LAB — GLUCOSE, CAPILLARY
Glucose-Capillary: 207 mg/dL — ABNORMAL HIGH (ref 70–99)
Glucose-Capillary: 215 mg/dL — ABNORMAL HIGH (ref 70–99)
Glucose-Capillary: 246 mg/dL — ABNORMAL HIGH (ref 70–99)
Glucose-Capillary: 270 mg/dL — ABNORMAL HIGH (ref 70–99)
Glucose-Capillary: 367 mg/dL — ABNORMAL HIGH (ref 70–99)

## 2023-05-17 LAB — BPAM FFP
Blood Product Expiration Date: 202408092359
Blood Product Expiration Date: 202408092359
ISSUE DATE / TIME: 202408061223
ISSUE DATE / TIME: 202408061223
Unit Type and Rh: 6200
Unit Type and Rh: 6200

## 2023-05-17 LAB — POCT I-STAT 7, (LYTES, BLD GAS, ICA,H+H)
Acid-base deficit: 1 mmol/L (ref 0.0–2.0)
Bicarbonate: 24.5 mmol/L (ref 20.0–28.0)
Calcium, Ion: 1.21 mmol/L (ref 1.15–1.40)
HCT: 21 % — ABNORMAL LOW (ref 39.0–52.0)
Hemoglobin: 7.1 g/dL — ABNORMAL LOW (ref 13.0–17.0)
O2 Saturation: 100 %
Patient temperature: 99
Potassium: 4.2 mmol/L (ref 3.5–5.1)
Sodium: 140 mmol/L (ref 135–145)
TCO2: 26 mmol/L (ref 22–32)
pCO2 arterial: 44.3 mmHg (ref 32–48)
pH, Arterial: 7.351 (ref 7.35–7.45)
pO2, Arterial: 389 mmHg — ABNORMAL HIGH (ref 83–108)

## 2023-05-17 LAB — PREPARE PLATELET PHERESIS: Unit division: 0

## 2023-05-17 LAB — BPAM CRYOPRECIPITATE
Blood Product Expiration Date: 202408102359
ISSUE DATE / TIME: 202408061811
Unit Type and Rh: 5100

## 2023-05-17 LAB — PREPARE CRYOPRECIPITATE: Unit division: 0

## 2023-05-17 LAB — PREPARE RBC (CROSSMATCH)

## 2023-05-17 LAB — PROTIME-INR
INR: 1.5 — ABNORMAL HIGH (ref 0.8–1.2)
Prothrombin Time: 18 seconds — ABNORMAL HIGH (ref 11.4–15.2)

## 2023-05-17 LAB — TRANSFUSION REACTION
DAT C3: NEGATIVE
Post RXN DAT IgG: NEGATIVE

## 2023-05-17 LAB — CULTURE, BLOOD (ROUTINE X 2)
Culture: NO GROWTH
Special Requests: ADEQUATE

## 2023-05-17 LAB — SARS CORONAVIRUS 2 BY RT PCR: SARS Coronavirus 2 by RT PCR: NEGATIVE

## 2023-05-17 LAB — MRSA NEXT GEN BY PCR, NASAL: MRSA by PCR Next Gen: NOT DETECTED

## 2023-05-17 LAB — LACTATE DEHYDROGENASE: LDH: 129 U/L (ref 98–192)

## 2023-05-17 SURGERY — ESOPHAGOGASTRODUODENOSCOPY (EGD) WITH PROPOFOL
Anesthesia: Moderate Sedation

## 2023-05-17 MED ORDER — INSULIN ASPART 100 UNIT/ML IJ SOLN
0.0000 [IU] | Freq: Once | INTRAMUSCULAR | Status: AC
  Administered 2023-05-17: 20 [IU] via SUBCUTANEOUS

## 2023-05-17 MED ORDER — INSULIN ASPART 100 UNIT/ML IJ SOLN
0.0000 [IU] | INTRAMUSCULAR | Status: DC
  Administered 2023-05-17: 11 [IU] via SUBCUTANEOUS
  Administered 2023-05-17 – 2023-05-18 (×4): 7 [IU] via SUBCUTANEOUS
  Administered 2023-05-18: 4 [IU] via SUBCUTANEOUS
  Administered 2023-05-18: 11 [IU] via SUBCUTANEOUS
  Administered 2023-05-18: 4 [IU] via SUBCUTANEOUS
  Administered 2023-05-18: 7 [IU] via SUBCUTANEOUS
  Administered 2023-05-18: 4 [IU] via SUBCUTANEOUS
  Administered 2023-05-19: 3 [IU] via SUBCUTANEOUS
  Administered 2023-05-19: 7 [IU] via SUBCUTANEOUS
  Administered 2023-05-19: 15 [IU] via SUBCUTANEOUS
  Administered 2023-05-19: 4 [IU] via SUBCUTANEOUS
  Administered 2023-05-19: 3 [IU] via SUBCUTANEOUS
  Administered 2023-05-19 – 2023-05-20 (×2): 11 [IU] via SUBCUTANEOUS
  Administered 2023-05-20 (×2): 15 [IU] via SUBCUTANEOUS
  Administered 2023-05-20: 11 [IU] via SUBCUTANEOUS
  Administered 2023-05-21: 7 [IU] via SUBCUTANEOUS

## 2023-05-17 MED ORDER — ORAL CARE MOUTH RINSE
15.0000 mL | OROMUCOSAL | Status: DC
  Administered 2023-05-17: 15 mL via OROMUCOSAL

## 2023-05-17 MED ORDER — LIDOCAINE 5 % EX PTCH
1.0000 | MEDICATED_PATCH | CUTANEOUS | Status: AC
  Filled 2023-05-17: qty 1

## 2023-05-17 MED ORDER — ACETAMINOPHEN 325 MG PO TABS
650.0000 mg | ORAL_TABLET | Freq: Four times a day (QID) | ORAL | Status: DC | PRN
  Filled 2023-05-17: qty 2

## 2023-05-17 MED ORDER — KETAMINE HCL 50 MG/5ML IJ SOSY
PREFILLED_SYRINGE | INTRAMUSCULAR | Status: AC
  Administered 2023-05-17: 150 mg via INTRAVENOUS
  Filled 2023-05-17: qty 15

## 2023-05-17 MED ORDER — SODIUM CHLORIDE 0.9 % IV BOLUS
1000.0000 mL | Freq: Once | INTRAVENOUS | Status: AC
  Administered 2023-05-17: 1000 mL via INTRAVENOUS

## 2023-05-17 MED ORDER — TRANEXAMIC ACID-NACL 1000-0.7 MG/100ML-% IV SOLN: 1000.0000 mg | INTRAVENOUS | Status: AC

## 2023-05-17 MED ORDER — ACETAMINOPHEN 500 MG PO TABS
500.0000 mg | ORAL_TABLET | Freq: Once | ORAL | Status: AC
  Administered 2023-05-17: 500 mg via ORAL
  Filled 2023-05-17: qty 1

## 2023-05-17 MED ORDER — FENTANYL CITRATE PF 50 MCG/ML IJ SOSY: 50.0000 ug | PREFILLED_SYRINGE | Freq: Once | INTRAMUSCULAR | Status: DC

## 2023-05-17 MED ORDER — MIDAZOLAM HCL (PF) 5 MG/ML IJ SOLN
INTRAMUSCULAR | Status: AC
  Filled 2023-05-17: qty 2

## 2023-05-17 MED ORDER — ROCURONIUM BROMIDE 10 MG/ML (PF) SYRINGE
PREFILLED_SYRINGE | INTRAVENOUS | Status: AC
  Administered 2023-05-17: 100 mg via INTRAVENOUS
  Filled 2023-05-17: qty 10

## 2023-05-17 MED ORDER — SODIUM CHLORIDE 0.9% IV SOLUTION: Freq: Once | INTRAVENOUS | Status: AC

## 2023-05-17 MED ORDER — ROCURONIUM BROMIDE 10 MG/ML (PF) SYRINGE: 100.0000 mg | PREFILLED_SYRINGE | Freq: Once | INTRAVENOUS | Status: AC

## 2023-05-17 MED ORDER — MIDAZOLAM HCL 2 MG/2ML IJ SOLN
4.0000 mg | Freq: Once | INTRAMUSCULAR | Status: AC
  Administered 2023-05-17: 4 mg via INTRAVENOUS
  Filled 2023-05-17: qty 4

## 2023-05-17 MED ORDER — LORAZEPAM 2 MG/ML IJ SOLN
0.5000 mg | INTRAMUSCULAR | Status: DC | PRN
  Administered 2023-05-17: 0.5 mg via INTRAVENOUS
  Filled 2023-05-17: qty 1

## 2023-05-17 MED ORDER — POLYETHYLENE GLYCOL 3350 17 G PO PACK: 17.0000 g | PACK | Freq: Every day | ORAL | Status: DC

## 2023-05-17 MED ORDER — FENTANYL CITRATE (PF) 100 MCG/2ML IJ SOLN
INTRAMUSCULAR | Status: AC
  Filled 2023-05-17: qty 4

## 2023-05-17 MED ORDER — SODIUM CHLORIDE 0.9% IV SOLUTION: Freq: Once | INTRAVENOUS | Status: DC

## 2023-05-17 MED ORDER — FENTANYL 2500MCG IN NS 250ML (10MCG/ML) PREMIX INFUSION
50.0000 ug/h | INTRAVENOUS | Status: DC
  Administered 2023-05-17: 50 ug/h via INTRAVENOUS
  Filled 2023-05-17: qty 250

## 2023-05-17 MED ORDER — FENTANYL BOLUS VIA INFUSION
50.0000 ug | INTRAVENOUS | Status: DC | PRN
  Administered 2023-05-17: 100 ug via INTRAVENOUS

## 2023-05-17 MED ORDER — METRONIDAZOLE 500 MG/100ML IV SOLN
500.0000 mg | Freq: Two times a day (BID) | INTRAVENOUS | Status: DC
  Administered 2023-05-17 – 2023-05-19 (×5): 500 mg via INTRAVENOUS
  Filled 2023-05-17 (×6): qty 100

## 2023-05-17 MED ORDER — KETAMINE HCL 50 MG/5ML IJ SOSY
1.5000 mg/kg | PREFILLED_SYRINGE | Freq: Once | INTRAMUSCULAR | Status: AC
  Filled 2023-05-17: qty 20

## 2023-05-17 MED ORDER — IOHEXOL 350 MG/ML SOLN
100.0000 mL | Freq: Once | INTRAVENOUS | Status: AC | PRN
  Administered 2023-05-17: 100 mL via INTRAVENOUS

## 2023-05-17 MED ORDER — PROPOFOL 1000 MG/100ML IV EMUL
0.0000 ug/kg/min | INTRAVENOUS | Status: DC
  Administered 2023-05-17: 10 ug/kg/min via INTRAVENOUS
  Filled 2023-05-17: qty 100

## 2023-05-17 MED ORDER — SODIUM CHLORIDE 0.9 % IV SOLN
2.0000 g | INTRAVENOUS | Status: DC
  Administered 2023-05-17 – 2023-05-20 (×4): 2 g via INTRAVENOUS
  Filled 2023-05-17 (×4): qty 20

## 2023-05-17 MED ORDER — ORAL CARE MOUTH RINSE: 15.0000 mL | OROMUCOSAL | Status: DC | PRN

## 2023-05-17 MED ORDER — CALCIUM GLUCONATE-NACL 1-0.675 GM/50ML-% IV SOLN
1.0000 g | Freq: Once | INTRAVENOUS | Status: AC
  Administered 2023-05-17: 1000 mg via INTRAVENOUS
  Filled 2023-05-17: qty 50

## 2023-05-17 MED ORDER — ACETAMINOPHEN 10 MG/ML IV SOLN
1000.0000 mg | Freq: Once | INTRAVENOUS | Status: AC | PRN
  Administered 2023-05-17: 1000 mg via INTRAVENOUS
  Filled 2023-05-17: qty 100

## 2023-05-17 MED ORDER — DOCUSATE SODIUM 50 MG/5ML PO LIQD: 100.0000 mg | Freq: Two times a day (BID) | ORAL | Status: DC

## 2023-05-17 MED ORDER — DIPHENHYDRAMINE HCL 25 MG PO CAPS
25.0000 mg | ORAL_CAPSULE | Freq: Once | ORAL | Status: AC
  Administered 2023-05-17: 25 mg via ORAL
  Filled 2023-05-17: qty 1

## 2023-05-17 MED ORDER — FOLIC ACID 5 MG/ML IJ SOLN
1.0000 mg | Freq: Every day | INTRAMUSCULAR | Status: DC
  Administered 2023-05-17 – 2023-05-20 (×4): 1 mg via INTRAVENOUS
  Filled 2023-05-17 (×4): qty 0.2

## 2023-05-17 MED ORDER — CHLORHEXIDINE GLUCONATE CLOTH 2 % EX PADS
6.0000 | MEDICATED_PAD | Freq: Every day | CUTANEOUS | Status: DC
  Administered 2023-05-17 – 2023-05-19 (×2): 6 via TOPICAL

## 2023-05-17 SURGICAL SUPPLY — 15 items

## 2023-05-17 NOTE — ED Notes (Signed)
At the time I was told by the physician to start the second bag of blood, I was also told by charge to move him to green.

## 2023-05-17 NOTE — Inpatient Diabetes Management (Signed)
Inpatient Diabetes Program Recommendations  AACE/ADA: New Consensus Statement on Inpatient Glycemic Control (2015)  Target Ranges:  Prepandial:   less than 140 mg/dL      Peak postprandial:   less than 180 mg/dL (1-2 hours)      Critically ill patients:  140 - 180 mg/dL   Lab Results  Component Value Date   GLUCAP 270 (H) 05/17/2023   HGBA1C 9.5 (H) 05/17/2023    Review of Glycemic Control  Latest Reference Range & Units 05/17/23 09:04 05/17/23 11:38  Glucose-Capillary 70 - 99 mg/dL 161 (H) 096 (H)   Diabetes history: New DM 2  Current orders for Inpatient glycemic control: Novolog 0-20 units Q4 hours  Inpatient Diabetes Program Recommendations:    -  start Semglee 15 units bid  Thanks,  Christena Deem RN, MSN, BC-ADM Inpatient Diabetes Coordinator Team Pager [phone] (8a-5p)

## 2023-05-17 NOTE — Progress Notes (Signed)
Repeat HB 5.9 after 2 units pRBC. Getting FFP now. Giving 2 more Rbc (will be 4th & 5th RBC in ~12 hrs), platelets, cryo.  Additional calcium ordered.  Steffanie Dunn, DO 05/17/23 1:48 PM [facility]

## 2023-05-17 NOTE — Progress Notes (Signed)
Patient seen and examined at bedside. Bleeding about 2 days, all BRBpR, no vomiting.  Last c-scope in 08/2022-- no UC lesions.  No history of varices or formal diagnosis of cirrhosis, but he knows he is at risk of this.   BP (!) 108/46 (BP Location: Left Arm)   Pulse 100   Temp 98.4 F (36.9 C) (Oral)   Resp (!) 22   Ht 6' (1.829 m)   Wt 122.1 kg   SpO2 100%   BMI 36.51 kg/m  Breathing comfortably on [place], CTAB S1S2, RRR Abd soft, NT Awake, fatigued appearing but answering questions appropriately Skin warm, dry, no skin tenting.   Full consult note to follow.  Adding 1 unit FFP to RBC transfusing. Need updated coags when we are able to draw more blood.  Steffanie Dunn, DO 05/17/23 9:30 AM [facility]  For contact information, see Amion. If no response to pager, please call PCCM consult pager. After hours, 7PM- 7AM, please call Elink.

## 2023-05-17 NOTE — Progress Notes (Signed)
Wife updated post-procedure with GI. Planning for repeat CT then hopefully wean to extubate from vent this afternoon.  Steffanie Dunn, DO 05/17/23 4:30 PM [facility]  For contact information, see Amion. If no response to pager, please call PCCM consult pager. After hours, 7PM- 7AM, please call Elink.

## 2023-05-17 NOTE — Progress Notes (Addendum)
No availability in endoscopy with anesthesia today. Discussed with Dr. Georgiann Cocker, who agrees that this is likely variceal and bleeding may worsen as we continue to volume resuscitate. We both agreed that getting an EGD today in the ICU would be in his best interest. I discussed this with Mr. Trine and his wife, who agreed. We discussed that he may remain intubated post-procedure today depending on the findings.  Discussed needing an Aline for continuous BP monitoring under procedural sedation and for lab draws as well. They verbally consented. RN getting consents for intubation, Aline, EGD.   BP 120/73   Pulse 88   Temp 98.7 F (37.1 C) (Oral)   Resp (!) 23   Ht 6' (1.829 m)   Wt 122.1 kg   SpO2 100%   BMI 36.51 kg/m  Mallampati 3 Awake, alert Breathing comfortably on [employer]. S1S2, RRR  Planning for intubation for EGD Aline placement Continued transfusions as ordered-- platelets coming from an outside hospital.  ASA 3  Steffanie Dunn, DO 05/17/23 2:58 PM [facility]  For contact information, see Amion. If no response to pager, please call PCCM consult pager. After hours, 7PM- 7AM, please call Elink.

## 2023-05-17 NOTE — Procedures (Signed)
Extubation Procedure Note  Patient Details:   Name: Darren [name] DOB: 08-04-1961 MRN: [id number]   Airway Documentation:    Vent end date: 05/17/23 Vent end time: 1825   Evaluation  O2 sats: stable throughout Complications: No apparent complications Patient did tolerate procedure well. Bilateral Breath Sounds: Coarse crackles, Diminished   Yes  Pt extubated to 3l [place] with RN at bedside. No cuff leak noted, CCM Dr. Chestine Spore aware. Pt is tolerating well and vitals stable. RT will monitor.  Lajuan Lines 05/17/2023, 6:32 PM

## 2023-05-17 NOTE — Significant Event (Signed)
Rapid Response Event Note   Reason for Call :  Hypotension, transfusion reaction  Initial Focused Assessment:  Patient supine in bed, pallor, A&O x4. Skin warm and dry. Lungs clear, heart tones normal.   72/39 (50) HR 114 RR 22 O2 98% RA  Interventions:  Blood already stopped and sent to lab CCM paged and to bedside PIV Total 1.5L NS bolus  SBP improved to 90s  Plan of Care:  Transfer to ICU, may need pressor. EGD once hemodynamically stabilized.  Event Summary:  MD Notified: Windell Norfolk MD, Kathie Rhodes Minor NP Call Time: 4098 Arrival Time: 1191 End Time: 4782  Truddie Crumble, RN

## 2023-05-17 NOTE — Consult Note (Addendum)
Referring Provider: [facility] Primary Care Physician:  Alysia Penna, MD Primary Gastroenterologist:  Dr. Dulce Sellar  Reason for Consultation: GI bleed  HPI: Darren [name] is a 62 y.o. male with past medical history of ulcerative colitis, history of iron deficiency anemia presented to the hospital with melena.  Was found to be hypotensive on initial evaluation.  Hemoglobin was down to 5.8 .  CT angio GI bleed protocol showed no active bleeding but showed evidence of cirrhosis with paraesophageal and perigastric varices.  Also showed nonspecific wall thickening of the duodenum concerning for duodenitis.  Patient seen and examined at bedside.  He started having melanotic stool as well as bright red blood per rectum rectum yesterday.  Multiple episodes since then.  Last episode this morning.  Complaining of weakness.  Denies any vomiting of blood.  He drinks 10 glasses of wine every day.  Denies any NSAID use.  Previous GI workup  Patient was seen in the GI clinic in June 2023 for evaluation of mild anemia.  His hemoglobin was around 10.3 last year based on available records.  Underwent EGD and colonoscopy in December 2023. EGD showed grade 1 small esophageal varices and gastritis.  Biopsies were negative for H. pylori. Colonoscopy showed likely burned-out colitis in the entire colon.  Biopsies showed chronic active colitis.  He was advised to stay on mesalamine and outpatient follow-up in a few weeks was recommended for further workup on cirrhosis.  Past Medical History:  Diagnosis Date   Allergy    Asthma    Gout    Ulcerative colitis (HCC)    followed by [facility]   Varicose veins     Past Surgical History:  Procedure Laterality Date   HERNIA REPAIR      Prior to Admission medications   Medication Sig Start Date End Date Taking? Authorizing Provider  albuterol (PROVENTIL HFA;VENTOLIN HFA) 108 (90 Base) MCG/ACT inhaler Inhale 2 puffs into the lungs every 6 (six) hours as needed. 03/22/17  Yes  McVey, Madelaine Bhat, PA-C  allopurinol (ZYLOPRIM) 300 MG tablet Take 1 tablet (300 mg total) by mouth daily. 06/07/18 05/16/23 Yes Sagardia, Eilleen Kempf, MD  ASPIRIN PO Take 2 tablets by mouth as needed (Takes as needed for pain in his knees).   Yes [provider]  Mesalamine 800 MG TBEC Take 1 tablet by mouth daily. 01/14/14  Yes Copland, Gwenlyn Found, MD  valsartan (DIOVAN) 160 MG tablet Take 160 mg by mouth daily.   Yes [provider]    Scheduled Meds:  sodium chloride   Intravenous Once   sodium chloride   Intravenous Once   sodium chloride   Intravenous Once   allopurinol  300 mg Oral Daily   Chlorhexidine Gluconate Cloth  6 each Topical Daily   folic acid  1 mg Intravenous Daily   insulin aspart  0-20 Units Subcutaneous Q4H   lidocaine  1 patch Transdermal Q24H   octreotide  50 mcg Intravenous Once   ondansetron (ZOFRAN) IV  4 mg Intravenous Once   thiamine (VITAMIN B1) injection  100 mg Intravenous Daily   Continuous Infusions:  sodium chloride 999 mL/hr at 05/17/23 0748   cefTRIAXone (ROCEPHIN)  IV     metronidazole Stopped (05/17/23 1032)   octreotide (SANDOSTATIN) 500 mcg in sodium chloride 0.9 % 250 mL (2 mcg/mL) infusion 50 mcg/hr (05/17/23 1200)   pantoprazole 8 mg/hr (05/17/23 1200)   sodium chloride     tranexamic acid Stopped (05/17/23 1314)   PRN Meds:.acetaminophen, albuterol, ondansetron **  OR** ondansetron (ZOFRAN) IV  Allergies as of 05/16/2023   (No Known Allergies)    Family History  Problem Relation Age of Onset   Cancer Father        colon cancer    Social History   Socioeconomic History   Marital status: Married    Spouse name: Not on file   Number of children: Not on file   Years of education: Not on file   Highest education level: Not on file  Occupational History   Not on file  Tobacco Use   Smoking status: Never   Smokeless tobacco: Never  Substance and Sexual Activity   Alcohol use: Yes    Alcohol/week: 0.0  standard drinks of alcohol    Comment: 10 drinks a week   Drug use: No   Sexual activity: Yes    Birth control/protection: Abstinence  Other Topics Concern   Not on file  Social History Narrative   Married   3 children   Works with [employer]   Exercises 3-4 days a week   Social Determinants of Corporate investment banker Strain: Not on file  Food Insecurity: No Food Insecurity (05/17/2023)   Hunger Vital Sign    Worried About Running Out of Food in the Last Year: Never true    Ran Out of Food in the Last Year: Never true  Transportation Needs: No Transportation Needs (05/17/2023)   PRAPARE - Administrator, Civil Service (Medical): No    Lack of Transportation (Non-Medical): No  Physical Activity: Not on file  Stress: Not on file  Social Connections: Unknown (09/28/2022)   Received from [facility]   Social Network    Social Network: Not on file  Intimate Partner Violence: Not At Risk (05/17/2023)   Humiliation, Afraid, Rape, and Kick questionnaire    Fear of Current or Ex-Partner: No    Emotionally Abused: No    Physically Abused: No    Sexually Abused: No    Review of Systems: All negative except as stated above in HPI.  Physical Exam: Vital signs: Vitals:   05/17/23 1247 05/17/23 1300  BP: (!) 100/54 (!) 107/58  Pulse: 90 97  Resp: (!) 22 (!) 26  Temp: 98.6 F (37 C)   SpO2: 100% 100%   Last BM Date : 05/17/23 General:   Alert,  Well-developed, well-nourished, pleasant and cooperative in NAD HEENT -normocephalic, atraumatic, extraocular movement intact Lungs: No visible respiratory distress Heart:  Regular rate and rhythm; no murmurs, clicks, rubs,  or gallops. Abdomen: Soft, nontender, nondistended, bowel sound present, no peritoneal signs Mood and affect normal Alert and oriented x 3 Rectal:  Deferred  GI:  Lab Results: Recent Labs    05/16/23 1307 05/17/23 0133 05/17/23 0611  WBC 11.7* 15.5* 23.6*  HGB 5.8* 5.9* 6.1*  HCT  22.0* 21.6* 21.0*  PLT 188 147* 170   BMET Recent Labs    05/16/23 1307 05/17/23 0133  NA 134* 135  K 5.0 4.6  CL 101 102  CO2 20* 19*  GLUCOSE 492* 427*  BUN 38* 33*  CREATININE 0.90 0.95  CALCIUM 9.0 8.6*   LFT Recent Labs    05/17/23 0133  PROT 5.0*  ALBUMIN 3.0*  AST 33  ALT 30  ALKPHOS 52  BILITOT 1.2   PT/INR Recent Labs    05/16/23 1600  LABPROT 17.7*  INR 1.4*     Studies/Results: CT ANGIO GI BLEED  Result Date: 05/16/2023 CLINICAL DATA:  Rectal bleeding, hypotension. EXAM: CTA ABDOMEN AND PELVIS WITHOUT AND WITH CONTRAST TECHNIQUE: Multidetector CT imaging of the abdomen and pelvis was performed using the standard protocol during bolus administration of intravenous contrast. Multiplanar reconstructed images and MIPs were obtained and reviewed to evaluate the vascular anatomy. Performed in conjunction with CTA of the chest, reported separately. RADIATION DOSE REDUCTION: This exam was performed according to the departmental dose-optimization program which includes automated exposure control, adjustment of the mA and/or kV according to patient size and/or use of iterative reconstruction technique. CONTRAST:  OMNIPAQUE IOHEXOL 350 MG/ML SOLN COMPARISON:  Abdominal ultrasound 09/23/2022, MRI 10/05/2022 FINDINGS: VASCULAR Aorta: Normal caliber aorta without aneurysm, dissection, vasculitis or significant stenosis. Mild to moderate atherosclerosis. Celiac: Patent without evidence of aneurysm, dissection, vasculitis or significant stenosis. SMA: Patent without evidence of aneurysm, dissection, vasculitis or significant stenosis. Renals: Single bilateral renal arteries. Both renal arteries are patent without evidence of aneurysm, dissection, vasculitis, fibromuscular dysplasia or significant stenosis. IMA: Patent, distal branches poorly assessed due to phase of contrast. Inflow: Patent without evidence of aneurysm, dissection, vasculitis or significant stenosis. Moderate  atherosclerosis. Proximal Outflow: Bilateral common femoral and visualized portions of the superficial and profunda femoral arteries are patent without evidence of aneurysm, dissection, vasculitis or significant stenosis. Veins: Venous phase imaging demonstrates sequela of portal hypertension with enlarged portal and splenic veins and numerous Paris off a gel varices. Additional varices in the anterior abdomen and left upper quadrant. Recannulized umbilical vein. Review of the MIP images confirms the above findings. NON-VASCULAR Lower chest: Assessed on concurrent chest CT, reported separately. Hepatobiliary: Cirrhotic hepatic morphology. No evidence of focal hepatic lesion. Gallstones within nondistended gallbladder. No biliary dilatation. Pancreas: The cystic pancreatic lesion on prior MRI is not confidently seen on the current exam. There is generalized fatty atrophy. No ductal dilatation. No evidence of pancreatic inflammation. Spleen: Enlarged, 14.2 cm AP. No splenic infarct or focal abnormality. Adrenals/Urinary Tract: No adrenal nodule. Renal parapelvic cysts. No further follow-up imaging is recommended. No hydronephrosis or suspicious renal abnormality. Unremarkable urinary bladder. Stomach/Bowel: There is no contrast accumulation in the GI tract to localize site of GI bleed. Ovoid hyperdensity within the sigmoid is present on precontrast exam and likely represent an ingested pill. Occasional areas of colonic wall thickening but no discrete pericolonic edema. There is mild wall thickening about the third portion of the duodenum with adjacent fat stranding, nonspecific. No additional small bowel inflammation. No obstruction. Periesophageal and perigastric varices. No associated contrast extravasation. Lymphatic: Prominent central mesenteric lymph nodes are likely reactive. Enlarged portal caval node measuring 18 mm short axis series 5, image 45. Additional prominent periportal and porta hepatis nodes.  Reproductive: The prostate gland is enlarged spanning 5.5 cm and causes mass effect on the bladder base. Other: Generalized edema of the intra-abdominal mesenteric fat suggestive of third spacing. Trace upper abdominal ascites. No free intra-abdominal air. Musculoskeletal: Diffuse lumbar spondylosis. There are no acute or suspicious osseous abnormalities. IMPRESSION: VASCULAR 1. No contrast accumulation in the GI tract to localize site of GI bleed. 2. Sequela of portal hypertension with portosystemic collaterals. Paraesophageal and perigastric varices. 3.  Aortic Atherosclerosis (ICD10-I70.0). NON-VASCULAR 1. Cirrhosis.  No focal lesion. 2. Splenomegaly.  Trace upper abdominal ascites. 3. Prominent porta hepatis nodes likely reactive. 4. Nonspecific wall thickening of the third portion of the duodenum with adjacent fat stranding, possibility of duodenitis is considered. 5. Enlarged prostate gland. 6. The cystic pancreatic lesion on prior MRI is not definitively seen. 7. Gallstones. Electronically Signed  By: Narda Rutherford M.D.   On: 05/16/2023 16:58   CT Angio Chest Pulmonary Embolism (PE) W or WO Contrast  Result Date: 05/16/2023 CLINICAL DATA:  High clinical suspicion for PE EXAM: CT ANGIOGRAPHY CHEST WITH CONTRAST TECHNIQUE: Multidetector CT imaging of the chest was performed using the standard protocol during bolus administration of intravenous contrast. Multiplanar CT image reconstructions and MIPs were obtained to evaluate the vascular anatomy. RADIATION DOSE REDUCTION: This exam was performed according to the departmental dose-optimization program which includes automated exposure control, adjustment of the mA and/or kV according to patient size and/or use of iterative reconstruction technique. CONTRAST:  OMNIPAQUE IOHEXOL 350 MG/ML SOLN COMPARISON:  Chest radiograph done today FINDINGS: Cardiovascular: There is homogeneous enhancement in thoracic aorta. There are no intraluminal filling defects  in central pulmonary artery branches. Motion artifacts limit evaluation of small peripheral pulmonary artery branches. Coronary artery calcifications are seen. Mediastinum/Nodes: No significant abnormalities are seen. Lungs/Pleura: There is no focal pulmonary consolidation. There is no pleural effusion or pneumothorax. Upper Abdomen: There is nodularity of the liver surface suggesting possible cirrhosis. Minimal ascites is noted in upper abdomen adjacent to liver and spleen. Spleen appears to be enlarged in size. Spleen is not included in its entirety limiting evaluation of actual size. Small hiatal hernia is seen. There are ectatic vessels along the lesser curvature aspect of the stomach suggesting possible varices due to portal hypertension. There is recanalization of umbilical vein. Musculoskeletal: Degenerative changes are noted in cervical and thoracic spine. Review of the MIP images confirms the above findings. IMPRESSION: There is no evidence of central pulmonary artery embolism. Evaluation of small peripheral pulmonary artery branches is limited by motion artifacts. There is no evidence of thoracic aortic dissection. Coronary artery calcifications are seen. There is no focal pulmonary consolidation. There is no pleural effusion or pneumothorax. Cirrhosis of liver. There are ectatic vessels along the lesser curvature aspect of stomach suggesting possible portal hypertension. Minimal ascites is seen in upper abdomen. Small hiatal hernia. Electronically Signed   By: Ernie Avena M.D.   On: 05/16/2023 16:50   DG Chest Portable 1 View  Result Date: 05/16/2023 CLINICAL DATA:  Hypotension, dizziness, diarrhea, and nausea EXAM: PORTABLE CHEST 1 VIEW COMPARISON:  Chest radiograph dated 03/22/2017 FINDINGS: Low lung volumes with bronchovascular crowding. No focal consolidations. No pleural effusion or pneumothorax. The heart size and mediastinal contours are within normal limits. No acute osseous  abnormality. IMPRESSION: Low lung volumes with bronchovascular crowding. No focal consolidations. Electronically Signed   By: Agustin Cree M.D.   On: 05/16/2023 16:43    Impression/Plan: -GI bleed with melena as well as bright red blood per rectum and patient with history of small esophageal varices based on EGD in December 2023.  Likely variceal bleeding. -Acute blood loss anemia.  Hemoglobin was down to 5.8.  Hemoglobin 6.1 after blood transfusion. -Cirrhosis, likely from alcohol use.  INR 1.4 yesterday.  Normal LFTs.  Stable platelet counts. -History of ulcerative colitis.  Currently on mesalamine.  Recommendations --------------------------- -Continue supportive care with octreotide infusion, Protonix and antibiotics -Recommend PRBC transfusion to keep hemoglobin around 7. -Tentative plan for EGD tomorrow morning if hemoglobin more than 7. -Absolute alcohol abstinence  -Management options discussed with patient and patient's family at bedside.  Patient wants to go home as soon as possible.  Advised that he is not medically stable for discharge yet.  Risks (bleeding, infection, bowel perforation that could require surgery, sedation-related changes in cardiopulmonary systems), benefits (identification and  possible treatment of source of symptoms, exclusion of certain causes of symptoms), and alternatives (watchful waiting, radiographic imaging studies, empiric medical treatment)  were explained to patient/family in detail and patient wishes to proceed.   Addendum at 2:57 PM Received call from critical care attending Dr. Chestine Spore.  Patient with ongoing bleeding and no improvement in hemoglobin after multiple unit of PRBC transfusion.  We will plan for urgent bedside EGD after critical care team intubates  the patient.   LOS: 1 day   Kathi Der  MD, FACP 05/17/2023, 1:18 PM  Contact #  [phone]

## 2023-05-17 NOTE — Consult Note (Addendum)
NAME:  Darren [name], MRN:  [id number], DOB:  [birth year], LOS: 1 ADMISSION DATE:  05/16/2023, CONSULTATION DATE: 05/17/2023 REFERRING MD: [facility], CHIEF COMPLAINT: Hypotension in setting of GI bleed and anemia   History of Present Illness:  62 year old male with a history that is remarkable for ulcerative colitis for which he takes medication daily and is followed by the [facility] will last colonoscopy in November.  He presents with 2 days of bloody stools hemoglobin down to 6.1 and a systolic blood pressure down in the 70s.  He is tachycardic 107.  Due to hypotension pulmonary critical care was called to the bedside.  He had 1 unit of packed cells second units with the reaction of fever 102 and a white count jumping to 23.  He had does have a second unit of packed cells going after being treated with steroids and Benadryl.  He will be transferred to the intensive care unit for further evaluation and treatment.  He will be fluid resuscitated.  He may require vasopressor support plus or minus central line insertion.  GI services have been contacted.  Pertinent  Medical History   Past Medical History:  Diagnosis Date   Allergy    Asthma    Gout    Ulcerative colitis (HCC)    followed by [facility]   Varicose veins      Significant Hospital Events: Including procedures, antibiotic start and stop dates in addition to other pertinent events   05/17/2023 transferred to intensive care unit  Interim History / Subjective:  62 year old male with severe anemia from GI bleed  Objective   Blood pressure (!) 79/40, pulse (!) 106, temperature 98.4 F (36.9 C), temperature source Oral, resp. rate 16, height 6' (1.829 m), weight 122.1 kg, SpO2 95%.        Intake/Output Summary (Last 24 hours) at 05/17/2023 0830 Last data filed at 05/17/2023 0355 Gross per 24 hour  Intake 588.07 ml  Output --  Net 588.07 ml   Filed Weights   05/16/23 1257  Weight: 122.1 kg    Examination: General: Obese  male no acute distress HENT: No JVD lymphadenopathy is appreciated Lungs: Diminished in the bases Cardiovascular: Sinus tachycardia 107 with a systolic blood pressure of 84 Abdomen: Obese distended but soft tender to have bloody stools approximately 6 over the last 2 days Extremities: Warm without edema Neuro: Grossly intact without focal defects he does so some confusion GU: Voids  Resolved Hospital Problem list       Assessment & Plan:  Acute blood loss anemia secondary to active GI bleed with resulting hypotension.   Chronic anemia with noncompliance with iron supplements GI consult Transfuse 2 units of packed red blood cells Transfused 2 units of fresh frozen plasma Coag studies Octreotide drip Proton pump inhibitor Transferred to intensive care unit Questionable need for central line Questionable need for vasopressor support Hold antihypertensives Broad-spectrum antibiotics for possible infection   Cirrhosis with portal hypertension and varices most likely secondary to EtOH abuse Continue to monitor  Gout Continue allopurinol  Morbid obesity Weight loss obese male   History of ulcerative colitis Continue to monitor  EtOH open approximately 10 drinks per week Add thiamine and folic acid for completeness Monitor for withdrawal  Hypertension Hold antihypertensives  Best Practice (right click and "Reselect all SmartList Selections" daily)   Diet/type: NPO DVT prophylaxis: not indicated GI prophylaxis: PPI Lines: N/A Foley:  N/A Code Status:  full code Last date of multidisciplinary goals of care  discussion [tbd ]  Labs   CBC: Recent Labs  Lab 05/16/23 1307 05/17/23 0133 05/17/23 0611  WBC 11.7* 15.5* 23.6*  HGB 5.8* 5.9* 6.1*  HCT 22.0* 21.6* 21.0*  MCV 63.6* 66.7* 66.2*  PLT 188 147* 170    Basic Metabolic Panel: Recent Labs  Lab 05/16/23 1307 05/17/23 0133  NA 134* 135  K 5.0 4.6  CL 101 102  CO2 20* 19*  GLUCOSE 492* 427*  BUN  38* 33*  CREATININE 0.90 0.95  CALCIUM 9.0 8.6*   GFR: Estimated Creatinine Clearance: 106 mL/min (by C-G formula based on SCr of 0.95 mg/dL). Recent Labs  Lab 05/16/23 1307 05/17/23 0133 05/17/23 0611  WBC 11.7* 15.5* 23.6*    Liver Function Tests: Recent Labs  Lab 05/16/23 1307 05/17/23 0133  AST 29 33  ALT 29 30  ALKPHOS 55 52  BILITOT 1.4* 1.2  PROT 5.0* 5.0*  ALBUMIN 2.9* 3.0*   Recent Labs  Lab 05/16/23 1600  LIPASE 40   No results for input(s): "AMMONIA" in the last 168 hours.  ABG No results found for: "PHART", "PCO2ART", "PO2ART", "HCO3", "TCO2", "ACIDBASEDEF", "O2SAT"   Coagulation Profile: Recent Labs  Lab 05/16/23 1600  INR 1.4*    Cardiac Enzymes: No results for input(s): "CKTOTAL", "CKMB", "CKMBINDEX", "TROPONINI" in the last 168 hours.  HbA1C: Hgb A1c MFr Bld  Date/Time Value Ref Range Status  05/17/2023 01:33 AM 9.5 (H) 4.8 - 5.6 % Final    Comment:    (NOTE) Pre diabetes:          5.7%-6.4%  Diabetes:              >6.4%  Glycemic control for   <7.0% adults with diabetes     CBG: No results for input(s): "GLUCAP" in the last 168 hours.  Review of Systems:   10 point review of system taken, please see HPI for positives and negatives.   Past Medical History:  He,  has a past medical history of Allergy, Asthma, Gout, Ulcerative colitis (HCC), and Varicose veins.   Surgical History:   Past Surgical History:  Procedure Laterality Date   HERNIA REPAIR       Social History:   reports that he has never smoked. He has never used smokeless tobacco. He reports current alcohol use. He reports that he does not use drugs.   Family History:  His family history includes Cancer in his father.   Allergies No Known Allergies   Home Medications  Prior to Admission medications   Medication Sig Start Date End Date Taking? Authorizing Provider  albuterol (PROVENTIL HFA;VENTOLIN HFA) 108 (90 Base) MCG/ACT inhaler Inhale 2 puffs into  the lungs every 6 (six) hours as needed. 03/22/17  Yes McVey, Madelaine Bhat, PA-C  allopurinol (ZYLOPRIM) 300 MG tablet Take 1 tablet (300 mg total) by mouth daily. 06/07/18 05/16/23 Yes Sagardia, Eilleen Kempf, MD  ASPIRIN PO Take 2 tablets by mouth as needed (Takes as needed for pain in his knees).   Yes [provider]  Mesalamine 800 MG TBEC Take 1 tablet by mouth daily. 01/14/14  Yes Copland, Gwenlyn Found, MD  valsartan (DIOVAN) 160 MG tablet Take 160 mg by mouth daily.   Yes [provider]     Critical care time: 35 min   Brett Canales  ACNP Acute Care Nurse Practitioner [facility] Please consult Amion 05/17/2023, 8:30 AM

## 2023-05-17 NOTE — Procedures (Signed)
Arterial Catheter Insertion Procedure Note  Kaien Vaden  [id number]  08/18/1961  Date:05/17/23  Time:3:47 PM    Provider Performing: Steffanie Dunn    Procedure: Insertion of Arterial Line (30865) with US guidance ([id number])   Indication(s) Blood pressure monitoring and/or need for frequent ABGs  Consent Risks of the procedure as well as the alternatives and risks of each were explained to the patient and/or caregiver.  Consent for the procedure was obtained and is signed in the bedside chart  Anesthesia None   Time Out Verified patient identification, verified procedure, site/side was marked, verified correct patient position, special equipment/implants available, medications/allergies/relevant history reviewed, required imaging and test results available.   Sterile Technique Maximal sterile technique including full sterile barrier drape, hand hygiene, sterile gown, sterile gloves, mask, hair covering, sterile ultrasound probe cover (if used).   Procedure Description Area of catheter insertion was cleaned with chlorhexidine and draped in sterile fashion. With real-time ultrasound guidance an arterial catheter was attempted to be placed placed into the right radial artery.  Procedure aborted due to unsuccessful attempt and need to start EGD. BP monitoring stable with cuff so far since intubated. Appropriate arterial tracings confirmed on monitor.     Complications/Tolerance None; patient tolerated the procedure well.   EBL Minimal   Specimen(s) None  Steffanie Dunn, DO 05/17/23 3:48 PM [facility]

## 2023-05-17 NOTE — Progress Notes (Signed)
Post intubation ABG obtained on ventilator settings of VT: 620, RR: 18, FIO2: 100%, and PEEP: 5.  Decreased FIO2 to 40%.  Unit RT aware.  No further ventilator changes at this time.  Will continue to monitor.    Latest Reference Range & Units 05/17/23 17:57  Sample type  ARTERIAL  pH, Arterial 7.35 - 7.45  7.351  pCO2 arterial 32 - 48 mmHg 44.3  pO2, Arterial 83 - 108 mmHg 389 (H)  TCO2 22 - 32 mmol/L 26  Acid-base deficit 0.0 - 2.0 mmol/L 1.0  Bicarbonate 20.0 - 28.0 mmol/L 24.5  O2 Saturation % 100  Patient temperature  99.0 F  Collection site  RADIAL, ALLEN'S TEST ACCEPTABLE

## 2023-05-17 NOTE — Procedures (Signed)
Intubation Procedure Note  Cruse Heinzerling  [id number]  09-21-1961  Date:05/17/23  Time:3:45 PM   Provider Performing: P Chestine Spore    Procedure: Intubation (31500)  Indication(s) Need for anesthesia for emergent EGD  Consent Risks of the procedure as well as the alternatives and risks of each were explained to the patient and/or caregiver.  Consent for the procedure was obtained and is signed in the bedside chart   Anesthesia Versed, Rocuronium, and ketamine   Time Out Verified patient identification, verified procedure, site/side was marked, verified correct patient position, special equipment/implants available, medications/allergies/relevant history reviewed, required imaging and test results available.   Sterile Technique Usual hand hygeine, masks, and gloves were used   Procedure Description Patient positioned in bed supine.  Sedation given as noted above.  Patient was intubated with endotracheal tube using Glidescope.  View was Grade 1 full glottis .  Number of attempts was 1.  Colorimetric CO2 detector was consistent with tracheal placement.   Complications/Tolerance None; patient tolerated the procedure well. Chest X-ray is ordered to verify placement.   EBL Minimal   Specimen(s) None  Steffanie Dunn, DO 05/17/23 3:46 PM [facility]

## 2023-05-17 NOTE — Progress Notes (Addendum)
PROGRESS NOTE        PATIENT DETAILS Name: Darren [name] Age: 62 y.o. Sex: male Date of Birth: [birth year] Admit Date: 05/16/2023 Admitting Physician Lonia Blood, MD [id number], No Pcp Per  Brief Summary: Patient is a 62 y.o.  male with history of presumed alcoholic liver cirrhosis, ulcerative colitis-presented with upper GI bleeding and acute blood loss anemia, patient was admitted to the hospitalist service-overnight-developed more GI bleeding associated with hypotension-and subsequently transferred to [facility] on 8/6.  Significant events: 8/05>> admit to [facility]-presumed variceal bleeding with acute blood loss anemia 8/06>> febrile reaction to second unit of PRBC, continued upper GI bleeding with hypotension-transferred to ICU.  Significant studies: 8/05>> CT angio chest: No PE 8/05>> CT angio GI bleed: No contrast extravasation  Significant microbiology data: 8/06>> blood culture: No growth 8/06>> COVID PCR: Negative 8/06>> respiratory virus panel: Pending  Procedures: None  Consults: GI PCCM  Subjective: Febrile-30 minutes into secondary to PRBC transfusion earlier this morning.  2 additional large bloody bowel movements earlier this morning.  Awake/alert.  Denies any abdominal pain.  Objective: Vitals: Blood pressure (!) 95/53, pulse (!) 108, temperature 99.7 F (37.6 C), temperature source Axillary, resp. rate (!) 22, height 6' (1.829 m), weight 122.1 kg, SpO2 100%.   Exam: Gen Exam:Alert awake-not in any distress HEENT:atraumatic, normocephalic Chest: B/L clear to auscultation anteriorly CVS:S1S2 regular Abdomen:soft non tender, non distended Extremities:no edema Neurology: Non focal Skin: no rash  Pertinent Labs/Radiology:    Latest Ref Rng & Units 05/17/2023    6:11 AM 05/17/2023    1:33 AM 05/16/2023    1:07 PM  CBC  WBC 4.0 - 10.5 K/uL 23.6  15.5  11.7   Hemoglobin 13.0 - 17.0 g/dL 6.1  5.9  5.8   Hematocrit 39.0 - 52.0 % 21.0   21.6  22.0   Platelets 150 - 400 K/uL 170  147  188     Lab Results  Component Value Date   NA 135 05/17/2023   K 4.6 05/17/2023   CL 102 05/17/2023   CO2 19 (L) 05/17/2023      Assessment/Plan: Hemorrhagic shock secondary to presumed variceal bleeding with acute blood loss anemia Being volume resuscitated with IV F-spoke with blood bank-and getting another unit of PRBC transfused (received 1 unit of PRBC last night-had febrile reaction to second unit of PRBC) Being transferred to the ICU for close monitoring GI consulted again this morning for EGD which will be done later today once patient hemodynamically stable In the interim-continue volume resuscitation-PPI infusion/octreotide infusion Follow CBC closely.  Febrile allergic reaction to PRBC transfusion Tolerated first unit without any major issues overnight-unfortunately developed fever 30 minutes into second unit of PRBC Spoke with blood bank-they will run relevant test-and we will try to send up another unit of PRBC as patient hemodynamically unstable. RN instructed to give Tylenol/Benadryl-and transfuse slowly. Monitor closely.  Alcoholic liver cirrhosis with portal hypertension Currently decompensated with variceal bleeding GI to follow  Ulcerative colitis Resume mesalamine when able  HTN Hypotensive-holding valsartan  Gout Allopurinol  Pancreatic cystic lesions Defer to GI  Obesity: Estimated body mass index is 36.51 kg/m as calculated from the following:   Height as of this encounter: 6' (1.829 m).   Weight as of this encounter: 122.1 kg.   Code status:   Code Status: Full Code   DVT Prophylaxis:  SCDs Start: 05/16/23 2012   Family Communication: Spouse at bedside   Disposition Plan: Status is: Inpatient Remains inpatient appropriate because: Severity of illness   Planned Discharge Destination:Home  The patient is critically ill with multiple organ system failure and requires high complexity  decision making for assessment and support, frequent evaluation and titration of therapies, advanced monitoring, review of radiographic studies and interpretation of complex data.    Diet: Diet Order             Diet NPO time specified  Diet effective midnight                     Antimicrobial agents: Anti-infectives (From admission, onward)    Start     Dose/Rate Route Frequency Ordered Stop   05/17/23 1600  cefTRIAXone (ROCEPHIN) 1 g in sodium chloride 0.9 % 100 mL IVPB  Status:  Discontinued        1 g 200 mL/hr over 30 Minutes Intravenous Every 24 hours 05/16/23 1956 05/17/23 0829   05/17/23 1600  cefTRIAXone (ROCEPHIN) 2 g in sodium chloride 0.9 % 100 mL IVPB        2 g 200 mL/hr over 30 Minutes Intravenous Every 24 hours 05/17/23 0829 05/23/23 1559   05/17/23 0915  metroNIDAZOLE (FLAGYL) IVPB 500 mg        500 mg 100 mL/hr over 60 Minutes Intravenous Every 12 hours 05/17/23 0829     05/16/23 2000  cefTRIAXone (ROCEPHIN) 1 g in sodium chloride 0.9 % 100 mL IVPB  Status:  Discontinued        1 g 200 mL/hr over 30 Minutes Intravenous Every 24 hours 05/16/23 1956 05/16/23 1956   05/16/23 1600  cefTRIAXone (ROCEPHIN) 1 g in sodium chloride 0.9 % 100 mL IVPB        1 g 200 mL/hr over 30 Minutes Intravenous  Once 05/16/23 1550 05/17/23 0205        MEDICATIONS: Scheduled Meds:  sodium chloride   Intravenous Once   sodium chloride   Intravenous Once   sodium chloride   Intravenous Once   allopurinol  300 mg Oral Daily   lidocaine  1 patch Transdermal Q24H   octreotide  50 mcg Intravenous Once   ondansetron (ZOFRAN) IV  4 mg Intravenous Once   thiamine (VITAMIN B1) injection  100 mg Intravenous Daily   Continuous Infusions:  sodium chloride 150 mL/hr at 05/17/23 0416   cefTRIAXone (ROCEPHIN)  IV     metronidazole     octreotide (SANDOSTATIN) 500 mcg in sodium chloride 0.9 % 250 mL (2 mcg/mL) infusion 50 mcg/hr (05/17/23 0439)   pantoprazole 8 mg/hr (05/17/23 0322)    sodium chloride     PRN Meds:.acetaminophen, albuterol, ondansetron **OR** ondansetron (ZOFRAN) IV, zolpidem   I have personally reviewed following labs and imaging studies  LABORATORY DATA: CBC: Recent Labs  Lab 05/16/23 1307 05/17/23 0133 05/17/23 0611  WBC 11.7* 15.5* 23.6*  HGB 5.8* 5.9* 6.1*  HCT 22.0* 21.6* 21.0*  MCV 63.6* 66.7* 66.2*  PLT 188 147* 170    Basic Metabolic Panel: Recent Labs  Lab 05/16/23 1307 05/17/23 0133  NA 134* 135  K 5.0 4.6  CL 101 102  CO2 20* 19*  GLUCOSE 492* 427*  BUN 38* 33*  CREATININE 0.90 0.95  CALCIUM 9.0 8.6*    GFR: Estimated Creatinine Clearance: 106 mL/min (by C-G formula based on SCr of 0.95 mg/dL).  Liver Function Tests: Recent Labs  Lab 05/16/23  1307 05/17/23 0133  AST 29 33  ALT 29 30  ALKPHOS 55 52  BILITOT 1.4* 1.2  PROT 5.0* 5.0*  ALBUMIN 2.9* 3.0*   Recent Labs  Lab 05/16/23 1600  LIPASE 40   No results for input(s): "AMMONIA" in the last 168 hours.  Coagulation Profile: Recent Labs  Lab 05/16/23 1600  INR 1.4*    Cardiac Enzymes: No results for input(s): "CKTOTAL", "CKMB", "CKMBINDEX", "TROPONINI" in the last 168 hours.  BNP (last 3 results) No results for input(s): "PROBNP" in the last 8760 hours.  Lipid Profile: No results for input(s): "CHOL", "HDL", "LDLCALC", "TRIG", "CHOLHDL", "LDLDIRECT" in the last 72 hours.  Thyroid Function Tests: No results for input(s): "TSH", "T4TOTAL", "FREET4", "T3FREE", "THYROIDAB" in the last 72 hours.  Anemia Panel: No results for input(s): "VITAMINB12", "FOLATE", "FERRITIN", "TIBC", "IRON", "RETICCTPCT" in the last 72 hours.  Urine analysis:    Component Value Date/Time   COLORURINE STRAW (A) 05/16/2023 1714   APPEARANCEUR CLEAR 05/16/2023 1714   LABSPEC 1.031 (H) 05/16/2023 1714   PHURINE 5.0 05/16/2023 1714   GLUCOSEU >=500 (A) 05/16/2023 1714   HGBUR NEGATIVE 05/16/2023 1714   BILIRUBINUR NEGATIVE 05/16/2023 1714   BILIRUBINUR negative  10/25/2015 1415   KETONESUR 5 (A) 05/16/2023 1714   PROTEINUR NEGATIVE 05/16/2023 1714   UROBILINOGEN 0.2 10/25/2015 1415   NITRITE NEGATIVE 05/16/2023 1714   LEUKOCYTESUR NEGATIVE 05/16/2023 1714    Sepsis Labs: Lactic Acid, Venous No results found for: "LATICACIDVEN"  MICROBIOLOGY: Recent Results (from the past 240 hour(s))  Culture, blood (Routine X 2) w Reflex to ID Panel     Status: None (Preliminary result)   Collection Time: 05/17/23  6:11 AM   Specimen: BLOOD  Result Value Ref Range Status   Specimen Description BLOOD RIGHT ANTECUBITAL  Final   Special Requests   Final    BOTTLES DRAWN AEROBIC AND ANAEROBIC Blood Culture adequate volume   Culture   Final    NO GROWTH <12 HOURS Performed at [facility], [address], [place], [place] [address]    Report Status PENDING  Incomplete    RADIOLOGY STUDIES/RESULTS: CT ANGIO GI BLEED  Result Date: 05/16/2023 CLINICAL DATA:  Rectal bleeding, hypotension. EXAM: CTA ABDOMEN AND PELVIS WITHOUT AND WITH CONTRAST TECHNIQUE: Multidetector CT imaging of the abdomen and pelvis was performed using the standard protocol during bolus administration of intravenous contrast. Multiplanar reconstructed images and MIPs were obtained and reviewed to evaluate the vascular anatomy. Performed in conjunction with CTA of the chest, reported separately. RADIATION DOSE REDUCTION: This exam was performed according to the departmental dose-optimization program which includes automated exposure control, adjustment of the mA and/or kV according to patient size and/or use of iterative reconstruction technique. CONTRAST:  OMNIPAQUE IOHEXOL 350 MG/ML SOLN COMPARISON:  Abdominal ultrasound 09/23/2022, MRI 10/05/2022 FINDINGS: VASCULAR Aorta: Normal caliber aorta without aneurysm, dissection, vasculitis or significant stenosis. Mild to moderate atherosclerosis. Celiac: Patent without evidence of aneurysm, dissection, vasculitis or significant stenosis.  SMA: Patent without evidence of aneurysm, dissection, vasculitis or significant stenosis. Renals: Single bilateral renal arteries. Both renal arteries are patent without evidence of aneurysm, dissection, vasculitis, fibromuscular dysplasia or significant stenosis. IMA: Patent, distal branches poorly assessed due to phase of contrast. Inflow: Patent without evidence of aneurysm, dissection, vasculitis or significant stenosis. Moderate atherosclerosis. Proximal Outflow: Bilateral common femoral and visualized portions of the superficial and profunda femoral arteries are patent without evidence of aneurysm, dissection, vasculitis or significant stenosis. Veins: Venous phase imaging demonstrates  sequela of portal hypertension with enlarged portal and splenic veins and numerous Paris off a gel varices. Additional varices in the anterior abdomen and left upper quadrant. Recannulized umbilical vein. Review of the MIP images confirms the above findings. NON-VASCULAR Lower chest: Assessed on concurrent chest CT, reported separately. Hepatobiliary: Cirrhotic hepatic morphology. No evidence of focal hepatic lesion. Gallstones within nondistended gallbladder. No biliary dilatation. Pancreas: The cystic pancreatic lesion on prior MRI is not confidently seen on the current exam. There is generalized fatty atrophy. No ductal dilatation. No evidence of pancreatic inflammation. Spleen: Enlarged, 14.2 cm AP. No splenic infarct or focal abnormality. Adrenals/Urinary Tract: No adrenal nodule. Renal parapelvic cysts. No further follow-up imaging is recommended. No hydronephrosis or suspicious renal abnormality. Unremarkable urinary bladder. Stomach/Bowel: There is no contrast accumulation in the GI tract to localize site of GI bleed. Ovoid hyperdensity within the sigmoid is present on precontrast exam and likely represent an ingested pill. Occasional areas of colonic wall thickening but no discrete pericolonic edema. There is mild wall  thickening about the third portion of the duodenum with adjacent fat stranding, nonspecific. No additional small bowel inflammation. No obstruction. Periesophageal and perigastric varices. No associated contrast extravasation. Lymphatic: Prominent central mesenteric lymph nodes are likely reactive. Enlarged portal caval node measuring 18 mm short axis series 5, image 45. Additional prominent periportal and porta hepatis nodes. Reproductive: The prostate gland is enlarged spanning 5.5 cm and causes mass effect on the bladder base. Other: Generalized edema of the intra-abdominal mesenteric fat suggestive of third spacing. Trace upper abdominal ascites. No free intra-abdominal air. Musculoskeletal: Diffuse lumbar spondylosis. There are no acute or suspicious osseous abnormalities. IMPRESSION: VASCULAR 1. No contrast accumulation in the GI tract to localize site of GI bleed. 2. Sequela of portal hypertension with portosystemic collaterals. Paraesophageal and perigastric varices. 3.  Aortic Atherosclerosis (ICD10-I70.0). NON-VASCULAR 1. Cirrhosis.  No focal lesion. 2. Splenomegaly.  Trace upper abdominal ascites. 3. Prominent porta hepatis nodes likely reactive. 4. Nonspecific wall thickening of the third portion of the duodenum with adjacent fat stranding, possibility of duodenitis is considered. 5. Enlarged prostate gland. 6. The cystic pancreatic lesion on prior MRI is not definitively seen. 7. Gallstones. Electronically Signed   By: Narda Rutherford M.D.   On: 05/16/2023 16:58   CT Angio Chest Pulmonary Embolism (PE) W or WO Contrast  Result Date: 05/16/2023 CLINICAL DATA:  High clinical suspicion for PE EXAM: CT ANGIOGRAPHY CHEST WITH CONTRAST TECHNIQUE: Multidetector CT imaging of the chest was performed using the standard protocol during bolus administration of intravenous contrast. Multiplanar CT image reconstructions and MIPs were obtained to evaluate the vascular anatomy. RADIATION DOSE REDUCTION: This exam  was performed according to the departmental dose-optimization program which includes automated exposure control, adjustment of the mA and/or kV according to patient size and/or use of iterative reconstruction technique. CONTRAST:  OMNIPAQUE IOHEXOL 350 MG/ML SOLN COMPARISON:  Chest radiograph done today FINDINGS: Cardiovascular: There is homogeneous enhancement in thoracic aorta. There are no intraluminal filling defects in central pulmonary artery branches. Motion artifacts limit evaluation of small peripheral pulmonary artery branches. Coronary artery calcifications are seen. Mediastinum/Nodes: No significant abnormalities are seen. Lungs/Pleura: There is no focal pulmonary consolidation. There is no pleural effusion or pneumothorax. Upper Abdomen: There is nodularity of the liver surface suggesting possible cirrhosis. Minimal ascites is noted in upper abdomen adjacent to liver and spleen. Spleen appears to be enlarged in size. Spleen is not included in its entirety limiting evaluation of actual size. Small hiatal  hernia is seen. There are ectatic vessels along the lesser curvature aspect of the stomach suggesting possible varices due to portal hypertension. There is recanalization of umbilical vein. Musculoskeletal: Degenerative changes are noted in cervical and thoracic spine. Review of the MIP images confirms the above findings. IMPRESSION: There is no evidence of central pulmonary artery embolism. Evaluation of small peripheral pulmonary artery branches is limited by motion artifacts. There is no evidence of thoracic aortic dissection. Coronary artery calcifications are seen. There is no focal pulmonary consolidation. There is no pleural effusion or pneumothorax. Cirrhosis of liver. There are ectatic vessels along the lesser curvature aspect of stomach suggesting possible portal hypertension. Minimal ascites is seen in upper abdomen. Small hiatal hernia. Electronically Signed   By: Ernie Avena  M.D.   On: 05/16/2023 16:50   DG Chest Portable 1 View  Result Date: 05/16/2023 CLINICAL DATA:  Hypotension, dizziness, diarrhea, and nausea EXAM: PORTABLE CHEST 1 VIEW COMPARISON:  Chest radiograph dated 03/22/2017 FINDINGS: Low lung volumes with bronchovascular crowding. No focal consolidations. No pleural effusion or pneumothorax. The heart size and mediastinal contours are within normal limits. No acute osseous abnormality. IMPRESSION: Low lung volumes with bronchovascular crowding. No focal consolidations. Electronically Signed   By: Agustin Cree M.D.   On: 05/16/2023 16:43     LOS: 1 day   Jeoffrey Massed, MD  [facility]    To contact the attending provider between 7A-7P or the covering provider during after hours 7P-7A, please log into the web site [email or web address] and access using universal [place] password for that web site. If you do not have the password, please call the hospital operator.  05/17/2023, 8:44 AM

## 2023-05-17 NOTE — Plan of Care (Signed)
  Problem: Clinical Measurements: Goal: Diagnostic test results will improve Outcome: Progressing   

## 2023-05-17 NOTE — Progress Notes (Signed)
Patient transported to CT and back to room 3M06, on ventilator, without complications.

## 2023-05-17 NOTE — ED Notes (Signed)
At the time blood was ordered

## 2023-05-17 NOTE — Op Note (Signed)
[facility] Patient Name: Darren [name] Procedure Date : 05/17/2023 MRN: [id number] Attending MD: Kathi Der , MD, [id number] Date of Birth: Feb 06, 1961 CSN: [id number] Age: 62 Admit Type: Inpatient Procedure:                Upper GI endoscopy Indications:              Active gastrointestinal bleeding Providers:                Kathi Der, MD, Fransisca Connors, Martha Clan, RN, Adin Hector, RN, Marja Kays,                            Technician Referring MD:              Medicines:                Sedation Administered by an Intensivist Complications:            No immediate complications. Estimated Blood Loss:     Estimated blood loss was minimal. Procedure:                Pre-Anesthesia Assessment:                           - Prior to the procedure, a History and Physical                            was performed, and patient medications and                            allergies were reviewed. The patient's tolerance of                            previous anesthesia was also reviewed. The risks                            and benefits of the procedure and the sedation                            options and risks were discussed with the patient.                            All questions were answered, and informed consent                            was obtained. Prior Anticoagulants: The patient has                            taken no anticoagulant or antiplatelet agents. ASA                            Grade Assessment: II - A patient with mild systemic  disease. After reviewing the risks and benefits,                            the patient was deemed in satisfactory condition to                            undergo the procedure.                           After obtaining informed consent, the endoscope was                            passed under direct vision. Throughout the                             procedure, the patient's blood pressure, pulse, and                            oxygen saturations were monitored continuously. The                            [id number]-[id number] ([id number] Olympus endoscope was introduced                            through the mouth, and advanced to the second part                            of duodenum. The upper GI endoscopy was                            accomplished without difficulty. The patient                            tolerated the procedure well. Findings:      Two columns of large (> 5 mm) varices with no bleeding and no stigmata       of recent bleeding were found in the lower third of the esophagus. Red       wale signs were present. Three bands were successfully placed with       complete eradication, resulting in deflation of varices.      Scattered mild inflammation characterized by congestion (edema) and       erythema was found in the gastric body, in the gastric antrum and in the       prepyloric region of the stomach.      The cardia and gastric fundus were normal on retroflexion.      Scattered mild inflammation characterized by congestion (edema) and       erythema was found in the first portion of the duodenum and in the       second portion of the duodenum. Impression:               - Large (> 5 mm) esophageal varices with no                            bleeding and no stigmata of recent bleeding.  Completely eradicated. Banded.                           - Gastritis.                           - Duodenitis.                           - No specimens collected. Recommendation:           - Return patient to ICU for ongoing care.                           - NPO.                           - Continue present medications. Procedure Code(s):        --- Professional ---                           [phone], Esophagogastroduodenoscopy, flexible,                            transoral; with band ligation of esophageal/gastric                             varices Diagnosis Code(s):        --- Professional ---                           I85.00, Esophageal varices without bleeding                           K29.70, Gastritis, unspecified, without bleeding                           K29.80, Duodenitis without bleeding                           K92.2, Gastrointestinal hemorrhage, unspecified CPT copyright 2022 American Medical Association. All rights reserved. The codes documented in this report are preliminary and upon coder review may  be revised to meet current compliance requirements. Kathi Der, MD Kathi Der, MD 05/17/2023 4:18:01 PM Number of Addenda: 0

## 2023-05-17 NOTE — Brief Op Note (Signed)
05/17/2023  4:11 PM  PATIENT:  Darren [name]  62 y.o. male  PRE-OPERATIVE DIAGNOSIS:  GI bleed  POST-OPERATIVE DIAGNOSIS:  Duodenitis; Gastritis; Small esophageal varices  PROCEDURE:  Procedure(s): ESOPHAGOGASTRODUODENOSCOPY (EGD) WITH PROPOFOL (N/A) ESOPHAGEAL BANDING  SURGEON:  Surgeons and Role:    * , , MD - Primary  Findings ---------- -EGD showed large lower esophageal varices with red vital signs but no evidence of active bleeding.  EGD also showed mild gastritis and duodenitis.  There was noted drop of blood in the esophagus, stomach or duodenum.  3 bands placed.  Recommendations -------------------------- -Recommend stat CT angio to rule out another source of bleeding. -Finding and management option discussed with Dr. Chestine Spore and patient's wife in the ICU. -If repeat CT angio negative and if he continues to bleed, he may need colonoscopy for further evaluation. -Continue other supportive care.  Keep him in ICU for now.  Kathi Der MD, FACP 05/17/2023, 4:13 PM  Contact #  [phone]

## 2023-05-18 ENCOUNTER — Encounter (HOSPITAL_COMMUNITY): Payer: Self-pay | Admitting: Internal Medicine

## 2023-05-18 ENCOUNTER — Inpatient Hospital Stay (HOSPITAL_COMMUNITY): Payer: BC Managed Care – PPO | Admitting: Anesthesiology

## 2023-05-18 ENCOUNTER — Encounter (HOSPITAL_COMMUNITY)
Admission: EM | Disposition: A | Discharge status: Home / Self Care | Payer: Self-pay | Source: Home / Self Care | Attending: Critical Care Medicine

## 2023-05-18 DIAGNOSIS — D649 Anemia, unspecified: Secondary | ICD-10-CM

## 2023-05-18 DIAGNOSIS — R578 Other shock: Secondary | ICD-10-CM | POA: Diagnosis not present

## 2023-05-18 DIAGNOSIS — K921 Melena: Secondary | ICD-10-CM | POA: Diagnosis not present

## 2023-05-18 DIAGNOSIS — K922 Gastrointestinal hemorrhage, unspecified: Secondary | ICD-10-CM | POA: Diagnosis not present

## 2023-05-18 HISTORY — PX: FLEXIBLE SIGMOIDOSCOPY: SHX5431

## 2023-05-18 LAB — CBC
HCT: 26.2 % — ABNORMAL LOW (ref 39.0–52.0)
HCT: 25.6 % — ABNORMAL LOW (ref 39.0–52.0)
HCT: 25.7 % — ABNORMAL LOW (ref 39.0–52.0)
Hemoglobin: 7.6 g/dL — ABNORMAL LOW (ref 13.0–17.0)
Hemoglobin: 7.6 g/dL — ABNORMAL LOW (ref 13.0–17.0)
Hemoglobin: 7.4 g/dL — ABNORMAL LOW (ref 13.0–17.0)
MCH: 21.6 pg — ABNORMAL LOW (ref 26.0–34.0)
MCH: 22 pg — ABNORMAL LOW (ref 26.0–34.0)
MCH: 21.1 pg — ABNORMAL LOW (ref 26.0–34.0)
MCHC: 29 g/dL — ABNORMAL LOW (ref 30.0–36.0)
MCHC: 29.7 g/dL — ABNORMAL LOW (ref 30.0–36.0)
MCHC: 28.8 g/dL — ABNORMAL LOW (ref 30.0–36.0)
MCV: 74.4 fL — ABNORMAL LOW (ref 80.0–100.0)
MCV: 74.2 fL — ABNORMAL LOW (ref 80.0–100.0)
MCV: 73.4 fL — ABNORMAL LOW (ref 80.0–100.0)
Platelets: 103 10*3/uL — ABNORMAL LOW (ref 150–400)
Platelets: 102 10*3/uL — ABNORMAL LOW (ref 150–400)
Platelets: 103 10*3/uL — ABNORMAL LOW (ref 150–400)
RBC: 3.52 MIL/uL — ABNORMAL LOW (ref 4.22–5.81)
RBC: 3.45 MIL/uL — ABNORMAL LOW (ref 4.22–5.81)
RBC: 3.5 MIL/uL — ABNORMAL LOW (ref 4.22–5.81)
RDW: 25.2 % — ABNORMAL HIGH (ref 11.5–15.5)
RDW: 25.3 % — ABNORMAL HIGH (ref 11.5–15.5)
RDW: 25.6 % — ABNORMAL HIGH (ref 11.5–15.5)
WBC: 7.5 10*3/uL (ref 4.0–10.5)
WBC: 6.9 10*3/uL (ref 4.0–10.5)
WBC: 6.6 10*3/uL (ref 4.0–10.5)
nRBC: 0.3 % — ABNORMAL HIGH (ref 0.0–0.2)
nRBC: 0 % (ref 0.0–0.2)
nRBC: 0.3 % — ABNORMAL HIGH (ref 0.0–0.2)

## 2023-05-18 LAB — GLUCOSE, CAPILLARY
Glucose-Capillary: 179 mg/dL — ABNORMAL HIGH (ref 70–99)
Glucose-Capillary: 186 mg/dL — ABNORMAL HIGH (ref 70–99)
Glucose-Capillary: 189 mg/dL — ABNORMAL HIGH (ref 70–99)
Glucose-Capillary: 208 mg/dL — ABNORMAL HIGH (ref 70–99)
Glucose-Capillary: 210 mg/dL — ABNORMAL HIGH (ref 70–99)
Glucose-Capillary: 217 mg/dL — ABNORMAL HIGH (ref 70–99)
Glucose-Capillary: 277 mg/dL — ABNORMAL HIGH (ref 70–99)

## 2023-05-18 LAB — PROTIME-INR
INR: 1.4 — ABNORMAL HIGH (ref 0.8–1.2)
Prothrombin Time: 17.1 seconds — ABNORMAL HIGH (ref 11.4–15.2)

## 2023-05-18 LAB — PREPARE RBC (CROSSMATCH)

## 2023-05-18 SURGERY — SIGMOIDOSCOPY, FLEXIBLE
Anesthesia: Monitor Anesthesia Care

## 2023-05-18 MED ORDER — LACTATED RINGERS IV SOLN
INTRAVENOUS | Status: AC | PRN
Start: 2023-05-18 — End: 2023-05-18
  Administered 2023-05-18: 1000 mL via INTRAVENOUS

## 2023-05-18 MED ORDER — PEG 3350-KCL-NA BICARB-NACL 420 G PO SOLR
4000.0000 mL | Freq: Once | ORAL | Status: AC
  Administered 2023-05-19: 4000 mL via ORAL
  Filled 2023-05-18: qty 4000

## 2023-05-18 MED ORDER — PANTOPRAZOLE SODIUM 40 MG IV SOLR
40.0000 mg | Freq: Two times a day (BID) | INTRAVENOUS | Status: DC
  Administered 2023-05-18 – 2023-05-21 (×7): 40 mg via INTRAVENOUS
  Filled 2023-05-18 (×7): qty 10

## 2023-05-18 MED ORDER — SODIUM CHLORIDE 0.9% IV SOLUTION: Freq: Once | INTRAVENOUS | Status: DC

## 2023-05-18 MED ORDER — CALCIUM GLUCONATE-NACL 2-0.675 GM/100ML-% IV SOLN
2.0000 g | Freq: Once | INTRAVENOUS | Status: AC
  Administered 2023-05-18: 2000 mg via INTRAVENOUS
  Filled 2023-05-18: qty 100

## 2023-05-18 MED ORDER — SODIUM CHLORIDE 0.9 % IV SOLN: INTRAVENOUS | Status: DC

## 2023-05-18 MED ORDER — FLEET ENEMA 7-19 GM/118ML RE ENEM
1.0000 | ENEMA | Freq: Once | RECTAL | Status: DC
  Filled 2023-05-18: qty 1

## 2023-05-18 MED ORDER — PROPOFOL 500 MG/50ML IV EMUL
INTRAVENOUS | Status: DC | PRN
  Administered 2023-05-18: 100 ug/kg/min via INTRAVENOUS
  Administered 2023-05-18: 50 mg via INTRAVENOUS
  Administered 2023-05-18: 100 mg via INTRAVENOUS

## 2023-05-18 MED ORDER — INSULIN GLARGINE-YFGN 100 UNIT/ML ~~LOC~~ SOLN
15.0000 [IU] | Freq: Every day | SUBCUTANEOUS | Status: DC
  Administered 2023-05-18 – 2023-05-20 (×3): 15 [IU] via SUBCUTANEOUS
  Filled 2023-05-18 (×3): qty 0.15

## 2023-05-18 NOTE — Brief Op Note (Signed)
05/18/2023  2:09 PM  PATIENT:  Darren [name]  62 y.o. male  PRE-OPERATIVE DIAGNOSIS:  GI bleed  POST-OPERATIVE DIAGNOSIS:  * No post-op diagnosis entered *  PROCEDURE:  Procedure(s): FLEXIBLE SIGMOIDOSCOPY (N/A)  SURGEON:  Surgeons and Role:    * , , MD - Primary  Findings ---------- -Colonoscopy showed fair prep with large amount of coffee-ground material in the entire examined colon.  Could not find any active bleeding source.  Given large amount of coffee-ground substance, there is a possibility of bleeding either in the right colon or small intestine.  Recommendations ----------------------- -Recommend colonoscopy tomorrow with a complete colonoscopy prep -Monitor H&H.  Transfuse if hemoglobin less than 7. -Okay to have clear liquid diet today.  Keep n.p.o. past midnight. -Management plan discussed with Dr. Chestine Spore.  Kathi Der MD, FACP 05/18/2023, 2:10 PM  Contact #  [phone]

## 2023-05-18 NOTE — Transfer of Care (Signed)
Immediate Anesthesia Transfer of Care Note  Patient: Darren [name]  Procedure(s) Performed: FLEXIBLE SIGMOIDOSCOPY  Patient Location: PACU and Endoscopy Unit  Anesthesia Type:MAC  Level of Consciousness: awake  Airway & Oxygen Therapy: Patient Spontanous Breathing  Post-op Assessment: Report given to RN  Post vital signs: stable  Last Vitals:  Vitals Value Taken Time  BP    Temp    Pulse    Resp    SpO2      Last Pain:  Vitals:   05/18/23 1251  TempSrc: Temporal  PainSc: 0-No pain         Complications: No notable events documented.

## 2023-05-18 NOTE — Evaluation (Signed)
Physical Therapy Evaluation Patient Details Name: Darren [name] MRN: [id number] DOB: 1960/11/10 Today's Date: 05/18/2023  History of Present Illness  62 year old male admitted 8/6  presenting with 2 days of bloody stools hemoglobin down to 6.1 and a systolic blood pressure down in the 70s. He is tachycardic 107.  He had 1 unit of packed cells second units with the reaction of fever 102 and a  count jumping to 23. He received a second unit of packed cells with Hgb still low. . Transfer to ICU.  EGD unremarkable on 8/6.  Flexible sigmoidoscopy 8/7.  PMH:  ulcerative colitis for which he takes medication daily and is followed by the [facility] will last colonoscopy in November.  Clinical Impression  Pt admitted with above diagnosis. Pt was able to ambulate without device with contact guard assist.  Did not challenge pt and pt was able to maintain balance well overall. Pt should progress well and not need any equipment or services at home. Will follow acutely.  Pt currently with functional limitations due to the deficits listed below (see PT Problem List). Pt will benefit from acute skilled PT to increase their independence and safety with mobility to allow discharge.           If plan is discharge home, recommend the following:     Can travel by private vehicle        Equipment Recommendations None recommended by PT  Recommendations for Other Services       Functional Status Assessment Patient has had a recent decline in their functional status and demonstrates the ability to make significant improvements in function in a reasonable and predictable amount of time.     Precautions / Restrictions Precautions Precautions: Fall Restrictions Weight Bearing Restrictions: No      Mobility  Bed Mobility Overal bed mobility: Independent                  Transfers Overall transfer level: Needs assistance Equipment used: None Transfers: Sit to/from Stand Sit to  Stand: Supervision                Ambulation/Gait Ambulation/Gait assistance: Contact guard assist Gait Distance (Feet): 400 Feet Assistive device: None Gait Pattern/deviations: Step-through pattern, Decreased stride length, Wide base of support   Gait velocity interpretation: <1.31 ft/sec, indicative of household ambulator   General Gait Details: Pt able to ambulate with min guard assist on unit without LOB and without challenges today.  Stairs            Wheelchair Mobility     Tilt Bed    Modified Rankin (Stroke Patients Only)       Balance                                             Pertinent Vitals/Pain Pain Assessment Pain Assessment: No/denies pain    Home Living Family/patient expects to be discharged to:: Private residence Living Arrangements: Spouse/significant other Available Help at Discharge: Family (wife works full time, can provide care if needed) Type of Home: House Home Access: Stairs to enter Entrance Stairs-Rails: Doctor, general practice of Steps: 2   Home Layout: Multi-level;Able to live on main level with bedroom/bathroom;Full bath on main level (office in basement) Home Equipment: Shower seat - built in;Hand held shower head Additional Comments: Full time sales job    Prior Function  Prior Level of Function : Driving;Needs assist;Working/employed             Mobility Comments: I PTA ADLs Comments: I PTA     Extremity/Trunk Assessment   Upper Extremity Assessment Upper Extremity Assessment: Defer to OT evaluation    Lower Extremity Assessment Lower Extremity Assessment: Overall WFL for tasks assessed    Cervical / Trunk Assessment Cervical / Trunk Assessment: Normal  Communication   Communication Communication: No apparent difficulties  Cognition Arousal: Alert Behavior During Therapy: WFL for tasks assessed/performed Overall Cognitive Status: Within Functional Limits for tasks  assessed                                          General Comments General comments (skin integrity, edema, etc.): 77 bpm, 96% RA, 109/70    Exercises     Assessment/Plan    PT Assessment Patient needs continued PT services  PT Problem List Decreased activity tolerance;Decreased balance;Decreased mobility;Decreased knowledge of use of DME;Decreased safety awareness       PT Treatment Interventions DME instruction;Gait training;Functional mobility training;Therapeutic activities;Therapeutic exercise;Balance training;Wheelchair mobility training;Patient/family education    PT Goals (Current goals can be found in the Care Plan section)  Acute Rehab PT Goals Patient Stated Goal: to go home PT Goal Formulation: With patient Time For Goal Achievement: 06/01/23 Potential to Achieve Goals: Good    Frequency Min 1X/week     Co-evaluation               AM-PAC PT "6 Clicks" Mobility  Outcome Measure Help needed turning from your back to your side while in a flat bed without using bedrails?: None Help needed moving from lying on your back to sitting on the side of a flat bed without using bedrails?: None Help needed moving to and from a bed to a chair (including a wheelchair)?: A Little Help needed standing up from a chair using your arms (e.g., wheelchair or bedside chair)?: A Little Help needed to walk in hospital room?: A Little Help needed climbing 3-5 steps with a railing? : A Little 6 Click Score: 20    End of Session Equipment Utilized During Treatment: Gait belt Activity Tolerance: Patient tolerated treatment well Patient left: in chair;with call bell/phone within reach;with chair alarm set Nurse Communication: Mobility status PT Visit Diagnosis: Muscle weakness (generalized) (M62.81)    Time: 5621-3086 PT Time Calculation (min) (ACUTE ONLY): 13 min   Charges:   PT Evaluation $PT Eval Low Complexity: 1 Low   PT General Charges $$ ACUTE PT  VISIT: 1 Visit          M,PT Acute Rehab Services [phone]   Bevelyn Buckles 05/18/2023, 1:18 PM

## 2023-05-18 NOTE — Progress Notes (Signed)
NAME:  Darren [name], MRN:  [id number], DOB:  1960/11/29, LOS: 2 ADMISSION DATE:  05/16/2023, CONSULTATION DATE:  05/17/23 REFERRING MD:  TRH, CHIEF COMPLAINT:  hypotension with GI bleed and anemia   History of Present Illness:  62 year old male with a history that is remarkable for ulcerative colitis for which he takes medication daily and is followed by the [facility] will last colonoscopy in November. He presents with 2 days of bloody stools hemoglobin down to 6.1 and a systolic blood pressure down in the 70s. He is tachycardic 107. Due to hypotension pulmonary critical care was called to the bedside. He had 1 unit of packed cells second units with the reaction of fever 102 and a white count jumping to 23. He had does have a second unit of packed cells going after being treated with steroids and Benadryl. He will be transferred to the intensive care unit for further evaluation and treatment. He will be fluid resuscitated. He may require vasopressor support plus or minus central line insertion. GI services have been contacted.   EGD and CT angio after continued drop in Hgb without active bleed.  Pertinent  Medical History   Past Medical History:  Diagnosis Date   Allergy    Asthma    Gout    Ulcerative colitis (HCC)    followed by [facility]   Varicose veins    Significant Hospital Events: Including procedures, antibiotic start and stop dates in addition to other pertinent events   05/17/2023 transferred to intensive care unit   Interim History / Subjective:  Had bedside EGD yesterday PM for continued low Hgb after 5 pRBCs transfusions. He was intubated and successfully extubated after the procedure and is now on [place]. EGD without active bleeding. Stat CT angio performed without active bleeding or new findings. GI recommended colonoscopy. Hgb remained 7 overnight; his 6th pRBC transfusion was given. He would like to go home ASAP. He would not like a colonoscopy for further evaluation of  bleed.  Objective   Blood pressure 105/71, pulse 75, temperature 98.1 F (36.7 C), temperature source Oral, resp. rate 17, height 6' (1.829 m), weight 122.1 kg, SpO2 100%.    Vent Mode: PSV;CPAP FiO2 (%):  [40 %-100 %] 40 % Set Rate:  [18 bmp] 18 bmp Vt Set:  [620 mL] 620 mL PEEP:  [5 cmH20] 5 cmH20 Pressure Support:  [5 cmH20] 5 cmH20   Intake/Output Summary (Last 24 hours) at 05/18/2023 0757 Last data filed at 05/18/2023 4782 Gross per 24 hour  Intake 6468.61 ml  Output 700 ml  Net 5768.61 ml   Filed Weights   05/16/23 1257  Weight: 122.1 kg    Examination: General: Sitting up in bed, NAD HENT: NCAT Lungs: CTAB anteriorly, breathing and speaking comfortably on RA Cardiovascular: RRR, flow murmur appreciated best at LSB Abdomen: Soft, distended, normoactive bowel sounds, nontender Extremities: Moves all grossly equally Neuro: Alert, speaking with me in full sentences, no gross focal deficit  Assessment & Plan:  Hemorrhagic shock due to GIB. Duodenitis on CT scan raises concern for IBD contributing. Less likely PUD, varices based on EGD. Less likely AVM based on CT angio. Hgb pending after 6th transfusion overnight. S/p 2u FFP, 1u cryoprecipitate, 1u plts. [facility] consulted, appreciated recs, consider flex sig today though patient does not desire this -Con't PPI IV as well as octreotide infusion -Q6h CBCs -maintain 3 PIVs -hold PTA antihypertensives -continue flagyl, CTX -obtain INR and BMP in AM   Transfusion  reaction- fever, now resolved. -con't transfusions prn due to hemorrhagic shock; can pre-medicate before others  -fu post H&Hs   Hyperglycemia and DM, appears to be a new diagnosis. Required over 50u SAI over last 24 hours. Goal CBG 140-180 in hospital. -SSI -Add on 15u semglee -needs OP DM education   H/o ETOH abuse -recommend cutting back on drinking -vitamins    History of gout -high risk for a flare this admission with transfusions -con't  allopurinol  Best Practice (right click and "Reselect all SmartList Selections" daily)   Diet/type: NPO for flex sig DVT prophylaxis: not indicated GI prophylaxis: PPI Lines: PIV Foley:  N/A Code Status:  full code Last date of multidisciplinary goals of care discussion [tbd]  Labs   CBC: Recent Labs  Lab 05/17/23 0133 05/17/23 0611 05/17/23 1215 05/17/23 1757 05/17/23 1928 05/18/23 0028  WBC 15.5* 23.6* 17.8*  --  12.2* 9.7  HGB 5.9* 6.1* 5.9* 7.1* 6.9* 7.0*  HCT 21.6* 21.0* 21.1* 21.0* 23.5* 24.0*  MCV 66.7* 66.2* 69.6*  --  71.4* 71.2*  PLT 147* 170 117*  --  108* 108*    Basic Metabolic Panel: Recent Labs  Lab 05/16/23 1307 05/17/23 0133 05/17/23 1757 05/18/23 0028  NA 134* 135 140 138  K 5.0 4.6 4.2 3.7  CL 101 102  --  107  CO2 20* 19*  --  25  GLUCOSE 492* 427*  --  233*  BUN 38* 33*  --  21  CREATININE 0.90 0.95  --  0.74  CALCIUM 9.0 8.6*  --  8.2*   Liver Function Tests: Recent Labs  Lab 05/16/23 1307 05/17/23 0133 05/18/23 0028  AST 29 33 35  ALT 29 30 36  ALKPHOS 55 52 42  BILITOT 1.4* 1.2 1.9*  PROT 5.0* 5.0* 5.1*  ALBUMIN 2.9* 3.0* 2.8*   Recent Labs  Lab 05/16/23 1600  LIPASE 40   ABG    Component Value Date/Time   PHART 7.351 05/17/2023 1757   PCO2ART 44.3 05/17/2023 1757   PO2ART 389 (H) 05/17/2023 1757   HCO3 24.5 05/17/2023 1757   TCO2 26 05/17/2023 1757   ACIDBASEDEF 1.0 05/17/2023 1757   O2SAT 100 05/17/2023 1757     Coagulation Profile: Recent Labs  Lab 05/16/23 1600 05/17/23 1215  INR 1.4* 1.5*    Critical care time:     Janeal Holmes, MD

## 2023-05-18 NOTE — Op Note (Signed)
[facility] Patient Name: Darren [name] Procedure Date : 05/18/2023 MRN: [id number] Attending MD: Kathi Der , MD, [id number] Date of Birth: Mar 22, 1961 CSN: [id number] Age: 62 Admit Type: Inpatient Procedure:                Flexible Sigmoidoscopy Indications:              Hematochezia Providers:                Kathi Der, MD, Stephens Shire RN, RN, Salley Scarlet, Technician, Penelope Galas, CRNA Referring MD:              Medicines:                Sedation Administered by an Anesthesia Professional Complications:            No immediate complications. Estimated Blood Loss:     Estimated blood loss was minimal. Procedure:                Pre-Anesthesia Assessment:                           - Prior to the procedure, a History and Physical                            was performed, and patient medications and                            allergies were reviewed. The patient's tolerance of                            previous anesthesia was also reviewed. The risks                            and benefits of the procedure and the sedation                            options and risks were discussed with the patient.                            All questions were answered, and informed consent                            was obtained. Prior Anticoagulants: The patient has                            taken no anticoagulant or antiplatelet agents. ASA                            Grade Assessment: III - A patient with severe                            systemic disease. After reviewing the risks and  benefits, the patient was deemed in satisfactory                            condition to undergo the procedure.                           After obtaining informed consent, the scope was                            passed under direct vision. The [id number]-[id number]                            ([id number] Olympus peds colonoscope was  introduced                            through the anus and advanced to the 40 cm from the                            anal verge. The flexible sigmoidoscopy was                            performed with difficulty due to poor endoscopic                            visualization. The patient tolerated the procedure                            well. The quality of the bowel preparation was                            inadequate. Scope In: 1:49:43 PM Scope Out: 1:56:58 PM Total Procedure Duration: 0 hours 7 minutes 15 seconds  Findings:      Hemorrhoids were found on perianal exam.      Hematin (altered blood/coffee-ground-like material) was found in the       rectum, in the recto-sigmoid colon and in the sigmoid colon.      Internal hemorrhoids were found during retroflexion. The hemorrhoids       were small. Impression:               - Preparation of the colon was inadequate.                           - Hemorrhoids found on perianal exam.                           - Blood in the rectum, in the recto-sigmoid colon                            and in the sigmoid colon.                           - Internal hemorrhoids.                           - No specimens collected.  Recommendation:           - Return patient to ICU for ongoing care.                           - Perform a colonoscopy tomorrow.                           - Clear liquid diet. Procedure Code(s):        --- Professional ---                           [phone], Sigmoidoscopy, flexible; diagnostic,                            including collection of specimen(s) by brushing or                            washing, when performed (separate procedure) Diagnosis Code(s):        --- Professional ---                           K64.8, Other hemorrhoids                           K62.5, Hemorrhage of anus and rectum                           K92.2, Gastrointestinal hemorrhage, unspecified                           K92.1, Melena (includes  Hematochezia) CPT copyright 2022 American [employer] Association. All rights reserved. The codes documented in this report are preliminary and upon coder review may  be revised to meet current compliance requirements. Kathi Der, MD Kathi Der, MD 05/18/2023 6:04:54 PM Number of Addenda: 0

## 2023-05-18 NOTE — TOC CM/SW Note (Addendum)
Transition of Care [facility]) - Inpatient Brief Assessment   Patient Details  Name: Darren [name] MRN: [id number] Date of Birth: [birth year]  [facility]) CM/SW Contact:    Tom-Johnson, Hershal Coria, RN Phone Number: 05/18/2023, 1:44 PM   Clinical Narrative:  Patient presented to the ED with dark bloody stool and low Blood Pressure. Hgb on admit was 5.8. Patient has received 6U PRBC to date. Hgb today at 7.6.  Patient was extubated yesterday, on Room Air. Has Hx of Liver Cirrhosis and Ulcerative Colitis. Underwent EGD yesterday 05/17/23 with no evidence of active bleeding, showed 2  large banded Esophageal Varices.   Patient is scheduled for Flex Sigmoidoscopy today 05/18/23.  GI following.   From home with wife, has three supportive children who lives out of state. Currently employed, independent with care and drive self prior to admission. Does not have DME's at home. PCP is Alysia Penna, MD and uses [facility] on [address].  No PT/OT f/u noted.   CM will continue to follow as patient progresses with care towards discharge.        Transition of Care Asessment: Insurance and Status: Insurance coverage has been reviewed Patient has primary care physician: Yes Home environment has been reviewed: Yes Prior level of function:: Independent Prior/Current Home Services: No current home services Social Determinants of Health Reivew: SDOH reviewed no interventions necessary Readmission risk has been reviewed: Yes Transition of care needs: no transition of care needs at this time

## 2023-05-18 NOTE — Anesthesia Preprocedure Evaluation (Signed)
Anesthesia Evaluation  Patient identified by MRN, date of birth, ID band Patient awake  General Assessment Comment:-Acute GI bleed with melena and bright red blood per rectum.  EGD yesterday showed no evidence of active bleeding but did showed 2 channels of large esophageal varices which were banded. -Acute blood loss anemia.  Status post 6 units of blood transfusion.  Hemoglobin 7.6 now. -Alcohol induced cirrhosis -History of ulcerative colitis.  Currently on mesalamine.  -CT scan concerning for duodenitis.   Recommendations ------------------------ -No significant improvement in hemoglobin despite of 6 units of blood transfusion.  Two CT angio negative for active bleeding source.  EGD yesterday was negative for active bleeding.  Recommend flex sig for further evaluation. -Discussed with Dr. Chestine Spore  -Discussed with patient's wife at bedside. -Continue other supportive care for now.  Continue octreotide for total 72 hours.  Change Protonix to IV twice daily PPI     Reviewed: Allergy & Precautions, H&P , NPO status , Patient's Chart, lab work & pertinent test results  Airway Mallampati: II  TM Distance: >3 FB Neck ROM: Full    Dental no notable dental hx.    Pulmonary neg pulmonary ROS   Pulmonary exam normal breath sounds clear to auscultation       Cardiovascular negative cardio ROS Normal cardiovascular exam Rhythm:Regular Rate:Normal     Neuro/Psych negative neurological ROS  negative psych ROS   GI/Hepatic PUD,,,(+) Cirrhosis     substance abuse  alcohol use  Endo/Other  negative endocrine ROS    Renal/GU negative Renal ROS  negative genitourinary   Musculoskeletal negative musculoskeletal ROS (+)    Abdominal   Peds negative pediatric ROS (+)  Hematology  (+) Blood dyscrasia, anemia   Anesthesia Other Findings   Reproductive/Obstetrics negative OB ROS                              Anesthesia Physical Anesthesia Plan  ASA: 3  Anesthesia Plan: MAC   Post-op Pain Management: Minimal or no pain anticipated   Induction: Intravenous  PONV Risk Score and Plan: 1 and Propofol infusion and Treatment may vary due to age or medical condition  Airway Management Planned: Simple Face Mask  Additional Equipment:   Intra-op Plan:   Post-operative Plan:   Informed Consent: I have reviewed the patients History and Physical, chart, labs and discussed the procedure including the risks, benefits and alternatives for the proposed anesthesia with the patient or authorized representative who has indicated his/her understanding and acceptance.     Dental advisory given  Plan Discussed with: CRNA and Surgeon  Anesthesia Plan Comments:        Anesthesia Quick Evaluation

## 2023-05-18 NOTE — Progress Notes (Signed)
eLink Physician-Brief Progress Note Patient Name: Darren [name] DOB: 02/20/1961 MRN: [id number]   Date of Service  05/18/2023  HPI/Events of Note  Acute upper GI bleed with variceal origin, hemoglobin 7 despite 1 unit PRBC.  eICU Interventions  Transfuse 1 unit     Intervention Category Intermediate Interventions: Bleeding - evaluation and treatment with blood products    05/18/2023, 2:32 AM

## 2023-05-18 NOTE — Anesthesia Postprocedure Evaluation (Signed)
Anesthesia Post Note  Patient: Darren [name]  Procedure(s) Performed: FLEXIBLE SIGMOIDOSCOPY     Patient location during evaluation: PACU Anesthesia Type: MAC Level of consciousness: awake and alert Pain management: pain level controlled Vital Signs Assessment: post-procedure vital signs reviewed and stable Respiratory status: spontaneous breathing, nonlabored ventilation, respiratory function stable and patient connected to nasal cannula oxygen Cardiovascular status: stable and blood pressure returned to baseline Postop Assessment: no apparent nausea or vomiting Anesthetic complications: no  No notable events documented.  Last Vitals:  Vitals:   05/18/23 1415 05/18/23 1430  BP: 113/64 109/64  Pulse: 75 77  Resp: 18 (!) 23  Temp:    SpO2: 98% 100%    Last Pain:  Vitals:   05/18/23 1407  TempSrc:   PainSc: 0-No pain                 , S

## 2023-05-18 NOTE — Progress Notes (Signed)
[facility] Gastroenterology Progress Note  Darren [name] 63 y.o. August 15, 1961  CC: Acute GI bleed   Subjective: Patient seen and examined at bedside.  Wife at bedside.  Looks like patient is having ongoing bleeding but it has slowed down.    ROS : Positive for fatigue and weakness.   Objective: Vital signs in last 24 hours: Vitals:   05/18/23 0737 05/18/23 1120  BP:    Pulse:    Resp:    Temp: 98.1 F (36.7 C) 98.3 F (36.8 C)  SpO2:      Physical Exam:  General:  Alert, cooperative, no distress, appears stated age  Head:  Normocephalic, without obvious abnormality, atraumatic  Eyes:  , EOM's intact,   Lungs:   No visible respiratory distress  Heart:  Regular rate and rhythm, S1, S2 normal  Abdomen:   Mildly distended but nontender, bowel sounds present, no peritoneal signs  Extremities: Extremities normal, atraumatic, no  edema  Pulses: 2+ and symmetric    Lab Results: Recent Labs    05/17/23 0133 05/17/23 1757 05/18/23 0028  NA 135 140 138  K 4.6 4.2 3.7  CL 102  --  107  CO2 19*  --  25  GLUCOSE 427*  --  233*  BUN 33*  --  21  CREATININE 0.95  --  0.74  CALCIUM 8.6*  --  8.2*   Recent Labs    05/17/23 0133 05/18/23 0028  AST 33 35  ALT 30 36  ALKPHOS 52 42  BILITOT 1.2 1.9*  PROT 5.0* 5.1*  ALBUMIN 3.0* 2.8*   Recent Labs    05/18/23 0028 05/18/23 1003  WBC 9.7 7.5  HGB 7.0* 7.6*  HCT 24.0* 26.2*  MCV 71.2* 74.4*  PLT 108* 103*   Recent Labs    05/17/23 1215 05/18/23 1001  LABPROT 18.0* 17.1*  INR 1.5* 1.4*      Assessment/Plan: -Acute GI bleed with melena and bright red blood per rectum.  EGD yesterday showed no evidence of active bleeding but did showed 2 channels of large esophageal varices which were banded. -Acute blood loss anemia.  Status post 6 units of blood transfusion.  Hemoglobin 7.6 now. -Alcohol induced cirrhosis -History of ulcerative colitis.  Currently on mesalamine.  -CT scan concerning for  duodenitis.  Recommendations ------------------------ -No significant improvement in hemoglobin despite of 6 units of blood transfusion.  Two CT angio negative for active bleeding source.  EGD yesterday was negative for active bleeding.  Recommend flex sig for further evaluation. -Discussed with Dr. Chestine Spore  -Discussed with patient's wife at bedside. -Continue other supportive care for now.  Continue octreotide for total 72 hours.  Change Protonix to IV twice daily PPI  Risks (bleeding, infection, bowel perforation that could require surgery, sedation-related changes in cardiopulmonary systems), benefits (identification and possible treatment of source of symptoms, exclusion of certain causes of symptoms), and alternatives (watchful waiting, radiographic imaging studies, empiric medical treatment)  were explained to patient/family in detail and patient wishes to proceed.    Kathi Der MD, FACP 05/18/2023, 11:28 AM  Contact #  [phone]

## 2023-05-18 NOTE — Evaluation (Signed)
Occupational Therapy Evaluation Patient Details Name: Darren [name] MRN: [id number] DOB: [birth year] Today's Date: 05/18/2023   History of Present Illness 62 year old male admitted 8/6  presenting with 2 days of bloody stools hemoglobin down to 6.1 and a systolic blood pressure down in the 70s. He is tachycardic 107.  He had 1 unit of packed cells second units with the reaction of fever 102 and a white count jumping to 23. He received a second unit of packed cells with Hgb still low. . Transfer to ICU.  EGD unremarkable on 8/6.  Flexible sigmoidoscopy 8/7.  PMH:  ulcerative colitis for which he takes medication daily and is followed by the [facility] will last colonoscopy in November.   Clinical Impression   Darren [name] was evaluated s/p the above admission list. He is indep, works full time and lives with his wife at baseline. Upon evaluation the pt was limited by decreased activity tolerance and mildly unsteady gait. Overall he needed up to contact guard  for mobility and ADLs, no AD. Pt will benefit from continued acute OT services and d/c home with support of family.         If plan is discharge home, recommend the following: Assistance with cooking/housework;Assist for transportation    Functional Status Assessment  Patient has had a recent decline in their functional status and demonstrates the ability to make significant improvements in function in a reasonable and predictable amount of time.  Equipment Recommendations  None recommended by OT       Precautions / Restrictions Precautions Precautions: Fall Restrictions Weight Bearing Restrictions: No      Mobility Bed Mobility Overal bed mobility: Independent                  Transfers Overall transfer level: Needs assistance Equipment used: None Transfers: Sit to/from Stand Sit to Stand: Contact guard assist                      ADL either performed or assessed with clinical judgement   ADL Overall  ADL's : Needs assistance/impaired Eating/Feeding: NPO   Grooming: Set up;Sitting   Upper Body Bathing: Set up;Sitting   Lower Body Bathing: Contact guard assist;Sit to/from stand   Upper Body Dressing : Set up;Sitting   Lower Body Dressing: Contact guard assist;Sit to/from stand   Toilet Transfer: Contact guard assist;Ambulation   Toileting- Clothing Manipulation and Hygiene: Independent;Sitting/lateral lean       Functional mobility during ADLs: Contact guard assist General ADL Comments: no AD, pt a little unsteady on his feet.     Vision Baseline Vision/History: 0 No visual deficits Vision Assessment?: No apparent visual deficits     Perception Perception: Not tested           Pertinent Vitals/Pain Pain Assessment Pain Assessment: No/denies pain     Extremity/Trunk Assessment Upper Extremity Assessment Upper Extremity Assessment: Overall WFL for tasks assessed   Lower Extremity Assessment Lower Extremity Assessment: Defer to PT evaluation   Cervical / Trunk Assessment Cervical / Trunk Assessment: Normal   Communication Communication Communication: No apparent difficulties   Cognition Arousal: Alert Behavior During Therapy: WFL for tasks assessed/performed Overall Cognitive Status: Within Functional Limits for tasks assessed             General Comments  VSS on RA< wife present     Home Living Family/patient expects to be discharged to:: Private residence Living Arrangements: Spouse/significant other Available Help at Discharge: Family Type of  Home: House Home Access: Stairs to enter [employer] of Steps: 2 Entrance Stairs-Rails: Right;Left Home Layout: Multi-level;Able to live on main level with bedroom/bathroom;Full bath on main level     Bathroom Shower/Tub: Producer, television/film/video: Standard     Home Equipment: Shower seat - built in;Hand held shower head   Additional Comments: Full time sales job      Prior  Functioning/Environment Prior Level of Function : Driving;Working/employed;Independent/Modified Independent             Mobility Comments: I PTA ADLs Comments: I PTA        OT Problem List: Decreased activity tolerance;Decreased safety awareness      OT Treatment/Interventions: Self-care/ADL training;DME and/or AE instruction;Therapeutic activities;Patient/family education;Therapeutic exercise    OT Goals(Current goals can be found in the care plan section) Acute Rehab OT Goals Patient Stated Goal: home OT Goal Formulation: With patient Time For Goal Achievement: 06/01/23 Potential to Achieve Goals: Good ADL Goals Additional ADL Goal #1: pt will complete BADLs independently  OT Frequency: Min 1X/week       AM-PAC OT "6 Clicks" Daily Activity     Outcome Measure Help from another person eating meals?: Total (NPO) Help from another person taking care of personal grooming?: A Little Help from another person toileting, which includes using toliet, bedpan, or urinal?: A Little Help from another person bathing (including washing, rinsing, drying)?: A Little Help from another person to put on and taking off regular upper body clothing?: A Little Help from another person to put on and taking off regular lower body clothing?: A Little 6 Click Score: 16   End of Session Nurse Communication: Mobility status  Activity Tolerance: Patient tolerated treatment well Patient left: in chair;with call bell/phone within reach;with chair alarm set  OT Visit Diagnosis: Unsteadiness on feet (R26.81)                Time: 6644-0347 OT Time Calculation (min): 13 min Charges:  OT General Charges $OT Visit: 1 Visit OT Evaluation $OT Eval Moderate Complexity: 1 Mod  Derenda Mis, OTR/L Acute Rehabilitation Services Office [phone] Secure Chat Communication Preferred   Donia Pounds 05/18/2023, 1:49 PM

## 2023-05-18 NOTE — Progress Notes (Signed)
eLink Physician-Brief Progress Note Patient Name: Darren [name] DOB: [birth year] MRN: [id number]   Date of Service  05/18/2023  HPI/Events of Note  No acute GI bleed with potential right colonic versus small bowel bleeding.  Underwent flexible sigmoidoscopy with coffee-ground contents throughout.  Started a colon prep but unable to finish by midnight.  Colonoscopy not scheduled until 10:15 AM.  Still having liquid black stools.  eICU Interventions  Will extend n.p.o. till 2 AM which still leaves 8 hours prior to colonoscopy.  Hopefully he is able to consume the remaining prep by them     Intervention Category Minor Interventions: Routine modifications to care plan (e.g. PRN medications for pain, fever)    05/18/2023, 11:55 PM

## 2023-05-19 ENCOUNTER — Inpatient Hospital Stay (HOSPITAL_COMMUNITY): Payer: BC Managed Care – PPO | Admitting: Anesthesiology

## 2023-05-19 ENCOUNTER — Encounter (HOSPITAL_COMMUNITY)
Admission: EM | Disposition: A | Discharge status: Home / Self Care | Payer: Self-pay | Source: Home / Self Care | Attending: Critical Care Medicine

## 2023-05-19 ENCOUNTER — Encounter (HOSPITAL_COMMUNITY): Payer: Self-pay | Admitting: Internal Medicine

## 2023-05-19 DIAGNOSIS — K922 Gastrointestinal hemorrhage, unspecified: Secondary | ICD-10-CM | POA: Diagnosis not present

## 2023-05-19 HISTORY — PX: HEMOSTASIS CLIP PLACEMENT: SHX6857

## 2023-05-19 HISTORY — PX: COLONOSCOPY WITH PROPOFOL: SHX5780

## 2023-05-19 LAB — GLUCOSE, CAPILLARY
Glucose-Capillary: 144 mg/dL — ABNORMAL HIGH (ref 70–99)
Glucose-Capillary: 148 mg/dL — ABNORMAL HIGH (ref 70–99)
Glucose-Capillary: 157 mg/dL — ABNORMAL HIGH (ref 70–99)
Glucose-Capillary: 159 mg/dL — ABNORMAL HIGH (ref 70–99)
Glucose-Capillary: 225 mg/dL — ABNORMAL HIGH (ref 70–99)
Glucose-Capillary: 256 mg/dL — ABNORMAL HIGH (ref 70–99)
Glucose-Capillary: 334 mg/dL — ABNORMAL HIGH (ref 70–99)

## 2023-05-19 SURGERY — COLONOSCOPY WITH PROPOFOL
Anesthesia: Monitor Anesthesia Care

## 2023-05-19 MED ORDER — PROPOFOL 500 MG/50ML IV EMUL
INTRAVENOUS | Status: DC | PRN
  Administered 2023-05-19: 125 ug/kg/min via INTRAVENOUS

## 2023-05-19 MED ORDER — POTASSIUM CHLORIDE 10 MEQ/100ML IV SOLN
10.0000 meq | INTRAVENOUS | Status: AC
  Administered 2023-05-19 (×2): 10 meq via INTRAVENOUS
  Filled 2023-05-19 (×4): qty 100

## 2023-05-19 MED ORDER — POTASSIUM CHLORIDE 10 MEQ/100ML IV SOLN
10.0000 meq | INTRAVENOUS | Status: AC
  Administered 2023-05-19: 10 meq via INTRAVENOUS
  Filled 2023-05-19 (×2): qty 100

## 2023-05-19 MED ORDER — LACTATED RINGERS IV SOLN: INTRAVENOUS | Status: DC | PRN

## 2023-05-19 MED ORDER — PROPOFOL 10 MG/ML IV BOLUS
INTRAVENOUS | Status: DC | PRN
Start: 2023-05-19 — End: 2023-05-19
  Administered 2023-05-19 (×2): 20 mg via INTRAVENOUS

## 2023-05-19 MED ORDER — METHYLPREDNISOLONE SODIUM SUCC 125 MG IJ SOLR
40.0000 mg | Freq: Two times a day (BID) | INTRAMUSCULAR | Status: DC
  Administered 2023-05-19 – 2023-05-20 (×3): 40 mg via INTRAVENOUS
  Filled 2023-05-19 (×3): qty 2

## 2023-05-19 MED ORDER — LIDOCAINE 2% (20 MG/ML) 5 ML SYRINGE
INTRAMUSCULAR | Status: DC | PRN
  Administered 2023-05-19: 20 mg via INTRAVENOUS

## 2023-05-19 SURGICAL SUPPLY — 22 items

## 2023-05-19 NOTE — Progress Notes (Addendum)
NAME:  Darren [name], MRN:  [id number], DOB:  Feb 24, 1961, LOS: 3 ADMISSION DATE:  05/16/2023, CONSULTATION DATE:  05/17/23 REFERRING MD:  TR[facility], CHIEF COMPLAINT:  hypotension with GI bleed, anemia   History of Present Illness:  62 year old male with a history that is remarkable for ulcerative colitis for which he takes medication daily and is followed by the [facility] will last colonoscopy in November. He presents with 2 days of bloody stools hemoglobin down to 6.1 and a systolic blood pressure down in the 70s. He is tachycardic 107. Due to hypotension pulmonary critical care was called to the bedside. He had 1 unit of packed cells second units with the reaction of fever 102 and a white count jumping to 23. He had does have a second unit of packed cells going after being treated with steroids and Benadryl. He will be transferred to the intensive care unit for further evaluation and treatment. He will be fluid resuscitated. He may require vasopressor support plus or minus central line insertion. GI services have been contacted.    EGD and CT angio after continued drop in Hgb without active bleed.  Pertinent  Medical History   Past Medical History:  Diagnosis Date   Allergy    Asthma    Gout    Ulcerative colitis (HCC)    followed by [facility]   Varicose veins    Significant Hospital Events: Including procedures, antibiotic start and stop dates in addition to other pertinent events   8/6 transferred to ICU after going to floor for persistent hypotension, worsening anemia, bedside EGD and CTA without active bleed 8/7 flex sig without active bleed but with presence of coffee-ground material 8/8 colonoscopy  Interim History / Subjective:  Has had 3 bowel movements this AM that were still "tarry," though they got lighter with each subsequent time. No abdominal pain. OK with colonoscopy. Wants to go home as soon as able.  Objective   Blood pressure 116/73, pulse 74, temperature 98.4 F  (36.9 C), temperature source Oral, resp. rate 16, height 6' (1.829 m), weight 122.1 kg, SpO2 98%.        Intake/Output Summary (Last 24 hours) at 05/19/2023 0732 Last data filed at 05/19/2023 6295 Gross per 24 hour  Intake 875.15 ml  Output --  Net 875.15 ml   Filed Weights   05/16/23 1257  Weight: 122.1 kg    Examination: General: Lying in bed, NAD HENT: NCAT, EOM grossly intact Lungs: CTAB anteriorly, speaking well on RA Cardiovascular: RR, flow murmur appreciated Abdomen: Soft, distended, nontender, normoactive bowel sounds Extremities: Moves all extremities grossly equally Neuro: Alert, speaking well with me, tracking examiner well  Resolved Hospital Problem list   Hemorrhagic shock Transfusion reaction with fever  Assessment & Plan:  Acute blood loss anemia due to lower GI bleed Presented initially with hemorrhagic shock, now stable Colonoscopy today. ?IBD component. Less likely PUD, varices based on EGD. Less likely AVM based on CT angio. Hgb 7.7; transfuse <7. Platelets 105, keep >100 with active bleed. S/p 6u pRBCs, 2u FFP, 1u cryoprecipitate, 1u plts. [facility] consulted, appreciated recs, follow up colonoscopy 8/8 -Con't PPI IV -Continue octreotide infusion (stop date 8/10) -Q6h CBCs -maintain 3 PIVs -hold PTA antihypertensives -continue ceftriaxone, stop flagyl  Hyperglycemia and DM, appears to be a new diagnosis. Required over 33u SAI over last 24 hours with improved glucose control. Goal CBG 140-180 in hospital. -SSI -Continue 15u semglee and monitor CBGs over course of day while NPO -needs  OP DM education   Cirrhosis H/o ETOH abuse INR today stable at 16.9 -recommend cutting back on drinking -vitamins  -consider propranolol at discharge   History of gout -high risk for a flare this admission with transfusions -con't allopurinol  Can transfer out of ICU for further GI workup given Hgb and pressure stability. Will sign out to [facility].  Best Practice  (right click and "Reselect all SmartList Selections" daily)   Diet/type: NPO for colonoscopy today, 8/8 DVT prophylaxis: not indicated GI prophylaxis: PPI Lines: PIV Foley:  N/A Code Status:  full code Last date of multidisciplinary goals of care discussion [tbd]  Labs   CBC: Recent Labs  Lab 05/18/23 0028 05/18/23 1003 05/18/23 1554 05/18/23 2139 05/19/23 0331  WBC 9.7 7.5 6.9 6.6 6.5  HGB 7.0* 7.6* 7.6* 7.4* 7.7*  HCT 24.0* 26.2* 25.6* 25.7* 26.0*  MCV 71.2* 74.4* 74.2* 73.4* 72.8*  PLT 108* 103* 102* 103* 105*    Basic Metabolic Panel: Recent Labs  Lab 05/16/23 1307 05/17/23 0133 05/17/23 1757 05/18/23 0028 05/19/23 0331  NA 134* 135 140 138 138  K 5.0 4.6 4.2 3.7 3.4*  CL 101 102  --  107 105  CO2 20* 19*  --  25 27  GLUCOSE 492* 427*  --  233* 158*  BUN 38* 33*  --  21 14  CREATININE 0.90 0.95  --  0.74 0.89  CALCIUM 9.0 8.6*  --  8.2* 8.2*   Liver Function Tests: Recent Labs  Lab 05/16/23 1307 05/17/23 0133 05/18/23 0028  AST 29 33 35  ALT 29 30 36  ALKPHOS 55 52 42  BILITOT 1.4* 1.2 1.9*  PROT 5.0* 5.0* 5.1*  ALBUMIN 2.9* 3.0* 2.8*   Recent Labs  Lab 05/16/23 1600  LIPASE 40   ABG    Component Value Date/Time   PHART 7.351 05/17/2023 1757   PCO2ART 44.3 05/17/2023 1757   PO2ART 389 (H) 05/17/2023 1757   HCO3 24.5 05/17/2023 1757   TCO2 26 05/17/2023 1757   ACIDBASEDEF 1.0 05/17/2023 1757   O2SAT 100 05/17/2023 1757     Coagulation Profile: Recent Labs  Lab 05/16/23 1600 05/17/23 1215 05/18/23 1001 05/19/23 0331  INR 1.4* 1.5* 1.4* 1.4*    CBG: Recent Labs  Lab 05/18/23 1112 05/18/23 1517 05/18/23 1920 05/18/23 2310 05/19/23 0316  GLUCAP 210* 189* 277* 179* 144*   Critical care time:     Janeal Holmes, MD  Attending Attestation:  I agree with the Resident's note, impression, and recommendations as outlined. I have taken an independent interval history, reviewed the chart and examined the patient.  My medical  decision making is as follows:  This is a 62 yo man with etoh use disorder, cirrhosis with varices, and ulcerative colitis. Transferred to ICU for hypotension in the setting of ABLA and febrile transfusion reaction. Has not needed further transfusion since he got here. CT Angio negative x2, flex sig negative, upper EGD showed inflammation and nonbleeding varices.  Today had colonoscopy. Found to have GI bleed secondary to cecal ulcer - likely secondary to his UC. Plan is to continue IV steroids until he has no further bleeding.   Stop flagyl - complete ctx for SBP ppx. Stop octreotide today.   Discussed new diagnosis Dm2 as well as need for etoh cessation with him and his wife.   Ok to sign out to [facility] to assume care 8/9- can go to med surg.   I spent 50 minutes in total visit time for  this patient, with more than 50% spent counseling/coordinating care.  Charlott Holler [facility] 05/19/2023 1:14 PM  Pager: see AMION  If no response to pager, please call critical care on call (see AMION) until 7pm After 7:00 pm call Elink

## 2023-05-19 NOTE — Progress Notes (Signed)
[facility] Gastroenterology Progress Note  Darren [name] 62 y.o. 1960-11-10  CC: Rectal bleeding   Subjective: Patient seen and examined at bedside in Endo unit.  Workup so far discussed with the patient.  I had an extensive discussion with patient's wife and another family member yesterday.  He had seen ongoing bleeding during the colonoscopy prep.  He think it might be old blood.  ROS : Afebrile, negative for chest pain   Objective: Vital signs in last 24 hours: Vitals:   05/19/23 0741 05/19/23 0949  BP:  135/82  Pulse:  72  Resp:  14  Temp: 98.2 F (36.8 C) 98.1 F (36.7 C)  SpO2:  98%    Physical Exam:  General:  Alert, cooperative, no distress, appears stated age  Head:  Normocephalic, without obvious abnormality, atraumatic  Eyes:  , EOM's intact,   Lungs:   Clear to auscultation bilaterally, respirations unlabored  Heart:  Regular rate and rhythm, S1, S2 normal  Abdomen:   Soft, non-tender, abdomen is mildly distended, bowel sounds present  Extremities: Extremities normal, atraumatic, no  edema  Pulses: 2+ and symmetric    Lab Results: Recent Labs    05/18/23 0028 05/19/23 0331  NA 138 138  K 3.7 3.4*  CL 107 105  CO2 25 27  GLUCOSE 233* 158*  BUN 21 14  CREATININE 0.74 0.89  CALCIUM 8.2* 8.2*   Recent Labs    05/17/23 0133 05/18/23 0028  AST 33 35  ALT 30 36  ALKPHOS 52 42  BILITOT 1.2 1.9*  PROT 5.0* 5.1*  ALBUMIN 3.0* 2.8*   Recent Labs    05/18/23 2139 05/19/23 0331  WBC 6.6 6.5  HGB 7.4* 7.7*  HCT 25.7* 26.0*  MCV 73.4* 72.8*  PLT 103* 105*   Recent Labs    05/18/23 1001 05/19/23 0331  LABPROT 17.1* 16.9*  INR 1.4* 1.4*      Assessment/Plan: -Acute GI bleed with melena and bright red blood per rectum.  EGD 08/06 showed no evidence of active bleeding but did showed 2 channels of large esophageal varices which were banded.  Flex sigs August 7 showed large amount of coffee-ground material in the entire colon. -Acute blood loss  anemia.  Status post 6 units of blood transfusion.  Hemoglobin 7.7 now. -Alcohol induced cirrhosis -History of ulcerative colitis.  Currently on mesalamine.  -CT scan concerning for duodenitis.   Recommendations ------------------------ -Proceed with colonoscopy today.  If colonoscopy negative for evidence of active bleeding, we may consider capsule endoscopy for further evaluation.  Management options were discussed with patient, patient's wife as well as other family members.  Risks (bleeding, infection, bowel perforation that could require surgery, sedation-related changes in cardiopulmonary systems), benefits (identification and possible treatment of source of symptoms, exclusion of certain causes of symptoms), and alternatives (watchful waiting, radiographic imaging studies, empiric medical treatment)  were explained to patient/family in detail and patient wishes to proceed.    Kathi Der MD, FACP 05/19/2023, 10:10 AM  Contact #  [phone]

## 2023-05-19 NOTE — Plan of Care (Signed)

## 2023-05-19 NOTE — Progress Notes (Signed)
Occupational Therapy Treatment Patient Details Name: Darren [name] MRN: [id number] DOB: 1961-03-07 Today's Date: 05/19/2023   History of present illness 62 year old male admitted 8/6  presenting with 2 days of bloody stools hemoglobin down to 6.1 and a systolic blood pressure down in the 70s. He is tachycardic 107.  He had 1 unit of packed cells second units with the reaction of fever 102 and a white count jumping to 23. He received a second unit of packed cells with Hgb still low. . Transfer to ICU.  EGD unremarkable on 8/6.  Flexible sigmoidoscopy 8/7.  PMH:  ulcerative colitis for which he takes medication daily and is followed by the [facility] will last colonoscopy in November.   OT comments  Patient seated up in recliner upon entry. Patient able to perform dressing tasks while standing with supervision to CGA. Patient performed mobility in hallway with IV pole and supervision with slight unsteadiness, possible due to colonoscopy from earlier today. Patient able to perform toileting with supervision and returned to bed with no physical assistance. Patient making good gains with OT treatment.       If plan is discharge home, recommend the following:  Assistance with cooking/housework;Assist for transportation   Equipment Recommendations  None recommended by OT    Recommendations for Other Services      Precautions / Restrictions Precautions Precautions: Fall Restrictions Weight Bearing Restrictions: No       Mobility Bed Mobility Overal bed mobility: Independent             General bed mobility comments: able to return to bed without assistance    Transfers Overall transfer level: Needs assistance Equipment used: None Transfers: Sit to/from Stand Sit to Stand: Supervision           General transfer comment: IV pole used but no LOB and supervision     Balance Overall balance assessment: No apparent balance deficits (not formally assessed)                                          ADL either performed or assessed with clinical judgement   ADL Overall ADL's : Needs assistance/impaired Eating/Feeding: Modified independent   Grooming: Supervision/safety;Standing           Upper Body Dressing : Supervision/safety;Standing   Lower Body Dressing: Contact guard assist;Sit to/from stand   Toilet Transfer: Supervision/safety             General ADL Comments: a little unsteady due to colonscopy earlier today    Extremity/Trunk Assessment              Vision       Perception     Praxis      Cognition Arousal: Alert Behavior During Therapy: WFL for tasks assessed/performed Overall Cognitive Status: Within Functional Limits for tasks assessed                                          Exercises      Shoulder Instructions       General Comments VSS on RA    Pertinent Vitals/ Pain       Pain Assessment Pain Assessment: No/denies pain  Home Living  Prior Functioning/Environment              Frequency  Min 1X/week        Progress Toward Goals  OT Goals(current goals can now be found in the care plan section)  Progress towards OT goals: Progressing toward goals  Acute Rehab OT Goals Patient Stated Goal: go home OT Goal Formulation: With patient Time For Goal Achievement: 06/01/23 Potential to Achieve Goals: Good ADL Goals Additional ADL Goal #1: pt will complete BADLs independently  Plan      Co-evaluation                 AM-PAC OT "6 Clicks" Daily Activity     Outcome Measure   Help from another person eating meals?: None Help from another person taking care of personal grooming?: A Little Help from another person toileting, which includes using toliet, bedpan, or urinal?: A Little Help from another person bathing (including washing, rinsing, drying)?: A Little Help from another  person to put on and taking off regular upper body clothing?: A Little Help from another person to put on and taking off regular lower body clothing?: A Little 6 Click Score: 19    End of Session    OT Visit Diagnosis: Unsteadiness on feet (R26.81)   Activity Tolerance Patient tolerated treatment well   Patient Left in bed;with call bell/phone within reach   Nurse Communication Mobility status        Time: 1353-1410 OT Time Calculation (min): 17 min  Charges: OT General Charges $OT Visit: 1 Visit OT Treatments $Self Care/Home Management : 8-22 mins  Alfonse Flavors, OTA Acute Rehabilitation Services  Office [phone]   Dewain Penning 05/19/2023, 2:21 PM

## 2023-05-19 NOTE — Transfer of Care (Signed)
Immediate Anesthesia Transfer of Care Note  Patient: Darren [name]  Procedure(s) Performed: COLONOSCOPY WITH PROPOFOL HEMOSTASIS CLIP PLACEMENT  Patient Location: Endoscopy Unit  Anesthesia Type:MAC  Level of Consciousness: awake, alert , and oriented  Airway & Oxygen Therapy: Patient Spontanous Breathing  Post-op Assessment: Report given to RN, Post -op Vital signs reviewed and stable, Patient moving all extremities X 4, and Patient able to stick tongue midline  Post vital signs: Reviewed and stable  Last Vitals:  Vitals Value Taken Time  BP 90/48   Temp 98.6   Pulse 72 05/19/23 1052  Resp 15 05/19/23 1052  SpO2 97 % 05/19/23 1052  Vitals shown include unfiled device data.  Last Pain:  Vitals:   05/19/23 0949  TempSrc: Temporal  PainSc: 0-No pain      Patients Stated Pain Goal: 0 (05/18/23 1600)  Complications: No notable events documented.

## 2023-05-19 NOTE — Brief Op Note (Signed)
05/19/2023  11:00 AM  PATIENT:  Darren [name]  62 y.o. male  PRE-OPERATIVE DIAGNOSIS:  GI bleed  POST-OPERATIVE DIAGNOSIS:  UC, Cecal ulcer- clip x 1 applied  PROCEDURE:  Procedure(s): COLONOSCOPY WITH PROPOFOL (N/A) HEMOSTASIS CLIP PLACEMENT  SURGEON:  Surgeons and Role:    * , , MD - Primary  Findings ------------ -Colonoscopy showed ulceration in the cecum with oozing of blood.  1 clip placed.  Several scattered areas of mild to moderate inflammation and friability throughout the colon.  Few inflammatory appearing small polyps not removed. -Not able to intubate TI.  Recommendations -------------------------- -Patient's bleeding likely from sacral ulcer from underlying ulcerative colitis.  He has friable mucosa throughout the colon.  Recommend to start him on IV Solu-Medrol to control the inflammation from ulcerative colitis.   -Steroid can be switched to p.o. tomorrow if no further bleeding episodes. -Okay to have full liquid diet today.  -Repeat labs in the morning. - GI will follow.  Kathi Der MD, FACP 05/19/2023, 11:03 AM  Contact #  [phone]

## 2023-05-19 NOTE — Progress Notes (Signed)
[facility] ADULT ICU REPLACEMENT PROTOCOL   The patient does apply for the [facility] Adult ICU Electrolyte Replacment Protocol based on the criteria listed below:   1.Exclusion criteria: TCTS, ECMO, Dialysis, and Myasthenia Gravis patients 2. Is GFR >/= 30 ml/min? Yes.    Patient's GFR today is >60 3. Is SCr </= 2? Yes.   Patient's SCr is 0.89 mg/dL 4. Did SCr increase >/= 0.5 in 24 hours? No. 5.Pt's weight >40kg  Yes.   6. Abnormal electrolyte(s): Potassium 3.4  7. Electrolytes replaced per protocol 8.  Call MD STAT for K+ </= 2.5, Phos </= 1, or Mag </= 1 Physician:  Dr. Faye Ramsay A  05/19/2023 5:39 AM

## 2023-05-19 NOTE — Op Note (Signed)
[facility] Patient Name: Darren [name] Procedure Date : 05/19/2023 MRN: [id number] Attending MD: Kathi Der , MD, [id number] Date of Birth: 1960-11-03 CSN: [id number] Age: 62 Admit Type: Inpatient Procedure:                Colonoscopy Indications:              Gastrointestinal bleeding Providers:                Kathi Der, MD, Martha Clan, RN, Harrington Challenger, Technician Referring MD:              Medicines:                Sedation Administered by an Anesthesia Professional Complications:            No immediate complications. Estimated Blood Loss:     Estimated blood loss was minimal. Procedure:                Pre-Anesthesia Assessment:                           - Prior to the procedure, a History and Physical                            was performed, and patient medications and                            allergies were reviewed. The patient's tolerance of                            previous anesthesia was also reviewed. The risks                            and benefits of the procedure and the sedation                            options and risks were discussed with the patient.                            All questions were answered, and informed consent                            was obtained. Prior Anticoagulants: The patient has                            taken no anticoagulant or antiplatelet agents                            except for aspirin. ASA Grade Assessment: III - A                            patient with severe systemic disease. After  reviewing the risks and benefits, the patient was                            deemed in satisfactory condition to undergo the                            procedure.                           After obtaining informed consent, the colonoscope                            was passed under direct vision. Throughout the                            procedure, the  patient's blood pressure, pulse, and                            oxygen saturations were monitored continuously. The                            CF-HQ190L ([id number]) Olympus coloscope was                            introduced through the anus and advanced to the the                            cecum, identified by appendiceal orifice and                            ileocecal valve. The colonoscopy was performed with                            difficulty due to significant looping. Successful                            completion of the procedure was aided by applying                            abdominal pressure. The patient tolerated the                            procedure well. The quality of the bowel                            preparation was fair. The ileocecal valve,                            appendiceal orifice, and rectum were photographed. Scope In: 10:28:29 AM Scope Out: 10:45:27 AM Scope Withdrawal Time: 0 hours 9 minutes 18 seconds  Total Procedure Duration: 0 hours 16 minutes 58 seconds  Findings:      Hemorrhoids were found on perianal exam.      A single (solitary) seven mm ulcer was found in the cecum. Oozing was  present. Stigmata of recent bleeding were present. For hemostasis, one       hemostatic clip was successfully placed. There was no bleeding at the       end of the procedure.      Scattered mild inflammation characterized by congestion (edema),       erosions, erythema and friability was found in the entire colon. Few       Scattered inflammatory appearing polyps, not removed.      A few three mm ulcers were found in the rectum. No bleeding was present.       No stigmata of recent bleeding were seen.      Internal hemorrhoids were found during retroflexion. The hemorrhoids       were small. Impression:               - Preparation of the colon was fair.                           - Hemorrhoids found on perianal exam.                           - A single  (solitary) ulcer in the cecum. Clip was                            placed.                           - Scattered mild inflammation was found in the                            entire examined colon secondary to pancolitis                            ulcerative colitis.                           - A few ulcers in the rectum.                           - Internal hemorrhoids.                           - No specimens collected. Recommendation:           - Return patient to ICU for ongoing care.                           - Full liquid diet.                           - Continue present medications. Procedure Code(s):        --- Professional ---                           [phone], Colonoscopy, flexible; with control of                            bleeding, any method Diagnosis Code(s):        ---  Professional ---                           K64.8, Other hemorrhoids                           K63.3, Ulcer of intestine                           K62.6, Ulcer of anus and rectum                           K51.00, Ulcerative (chronic) pancolitis without                            complications                           K92.2, Gastrointestinal hemorrhage, unspecified CPT copyright 2022 American Medical Association. All rights reserved. The codes documented in this report are preliminary and upon coder review may  be revised to meet current compliance requirements. Kathi Der, MD Kathi Der, MD 05/19/2023 11:05:15 AM Number of Addenda: 0

## 2023-05-19 NOTE — Anesthesia Postprocedure Evaluation (Signed)
Anesthesia Post Note  Patient: Darren [name]  Procedure(s) Performed: COLONOSCOPY WITH PROPOFOL HEMOSTASIS CLIP PLACEMENT     Patient location during evaluation: PACU Anesthesia Type: MAC Level of consciousness: awake and alert Pain management: pain level controlled Vital Signs Assessment: post-procedure vital signs reviewed and stable Respiratory status: spontaneous breathing Cardiovascular status: stable Anesthetic complications: no   No notable events documented.  Last Vitals:  Vitals:   05/19/23 1118 05/19/23 1137  BP: 113/74   Pulse: 70   Resp: 17   Temp:  36.7 C  SpO2: 99%     Last Pain:  Vitals:   05/19/23 1200  TempSrc:   PainSc: 4                   [employer]

## 2023-05-19 NOTE — Anesthesia Preprocedure Evaluation (Addendum)
Anesthesia Evaluation  Patient identified by MRN, date of birth, ID band Patient awake  General Assessment Comment:-Acute GI bleed with melena and bright red blood per rectum.  EGD yesterday showed no evidence of active bleeding but did showed 2 channels of large esophageal varices which were banded. -Acute blood loss anemia.  Status post 6 units of blood transfusion.  Hemoglobin 7.6 now. -Alcohol induced cirrhosis -History of ulcerative colitis.  Currently on mesalamine.  -CT scan concerning for duodenitis.   Recommendations ------------------------ -No significant improvement in hemoglobin despite of 6 units of blood transfusion.  Two CT angio negative for active bleeding source.  EGD yesterday was negative for active bleeding.  Recommend flex sig for further evaluation. -Discussed with Dr. Chestine Spore  -Discussed with patient's wife at bedside. -Continue other supportive care for now.  Continue octreotide for total 72 hours.  Change Protonix to IV twice daily PPI     Reviewed: Allergy & Precautions, H&P , NPO status , Patient's Chart, lab work & pertinent test results  Airway Mallampati: III  TM Distance: >3 FB Neck ROM: Full    Dental no notable dental hx. (+) Dental Advisory Given, Teeth Intact   Pulmonary asthma , neg recent URI   Pulmonary exam normal breath sounds clear to auscultation       Cardiovascular negative cardio ROS  Rhythm:Regular Rate:Normal + Systolic murmurs    Neuro/Psych negative neurological ROS  negative psych ROS   GI/Hepatic PUD,,,(+) Cirrhosis     substance abuse  alcohol use  Endo/Other  negative endocrine ROS    Renal/GU negative Renal ROS     Musculoskeletal negative musculoskeletal ROS (+)    Abdominal   Peds  Hematology  (+) Blood dyscrasia, anemia   Anesthesia Other Findings   Reproductive/Obstetrics                             Anesthesia  Physical Anesthesia Plan  ASA: 3  Anesthesia Plan: MAC   Post-op Pain Management: Minimal or no pain anticipated   Induction: Intravenous  PONV Risk Score and Plan: 1 and Propofol infusion, Treatment may vary due to age or medical condition and TIVA  Airway Management Planned: Simple Face Mask  Additional Equipment:   Intra-op Plan:   Post-operative Plan:   Informed Consent: I have reviewed the patients History and Physical, chart, labs and discussed the procedure including the risks, benefits and alternatives for the proposed anesthesia with the patient or authorized representative who has indicated his/her understanding and acceptance.     Dental advisory given  Plan Discussed with: CRNA  Anesthesia Plan Comments:        Anesthesia Quick Evaluation

## 2023-05-20 ENCOUNTER — Other Ambulatory Visit (HOSPITAL_COMMUNITY): Payer: Self-pay

## 2023-05-20 ENCOUNTER — Encounter: Payer: Self-pay | Admitting: Hematology and Oncology

## 2023-05-20 DIAGNOSIS — K922 Gastrointestinal hemorrhage, unspecified: Secondary | ICD-10-CM | POA: Diagnosis not present

## 2023-05-20 LAB — GLUCOSE, CAPILLARY
Glucose-Capillary: 278 mg/dL — ABNORMAL HIGH (ref 70–99)
Glucose-Capillary: 330 mg/dL — ABNORMAL HIGH (ref 70–99)
Glucose-Capillary: 277 mg/dL — ABNORMAL HIGH (ref 70–99)
Glucose-Capillary: 332 mg/dL — ABNORMAL HIGH (ref 70–99)
Glucose-Capillary: 298 mg/dL — ABNORMAL HIGH (ref 70–99)

## 2023-05-20 LAB — CBC
HCT: 27.3 % — ABNORMAL LOW (ref 39.0–52.0)
Hemoglobin: 7.9 g/dL — ABNORMAL LOW (ref 13.0–17.0)
MCH: 21.2 pg — ABNORMAL LOW (ref 26.0–34.0)
MCHC: 28.9 g/dL — ABNORMAL LOW (ref 30.0–36.0)
MCV: 73.4 fL — ABNORMAL LOW (ref 80.0–100.0)
Platelets: 106 10*3/uL — ABNORMAL LOW (ref 150–400)
RBC: 3.72 MIL/uL — ABNORMAL LOW (ref 4.22–5.81)
RDW: 26.3 % — ABNORMAL HIGH (ref 11.5–15.5)
WBC: 5.4 10*3/uL (ref 4.0–10.5)
nRBC: 0.4 % — ABNORMAL HIGH (ref 0.0–0.2)

## 2023-05-20 LAB — BASIC METABOLIC PANEL
Anion gap: 10 (ref 5–15)
BUN: 16 mg/dL (ref 8–23)
CO2: 25 mmol/L (ref 22–32)
Calcium: 8.2 mg/dL — ABNORMAL LOW (ref 8.9–10.3)
Chloride: 101 mmol/L (ref 98–111)
Creatinine, Ser: 0.78 mg/dL (ref 0.61–1.24)
GFR, Estimated: >60 mL/min (ref >60)
Glucose, Bld: 322 mg/dL — ABNORMAL HIGH (ref 70–99)
Potassium: 4.2 mmol/L (ref 3.5–5.1)
Sodium: 136 mmol/L (ref 135–145)

## 2023-05-20 MED ORDER — INSULIN ASPART 100 UNIT/ML IJ SOLN
6.0000 [IU] | Freq: Three times a day (TID) | INTRAMUSCULAR | Status: DC
  Administered 2023-05-21 (×2): 6 [IU] via SUBCUTANEOUS

## 2023-05-20 MED ORDER — INSULIN GLARGINE-YFGN 100 UNIT/ML ~~LOC~~ SOLN
25.0000 [IU] | Freq: Every day | SUBCUTANEOUS | Status: DC
  Filled 2023-05-20: qty 0.25

## 2023-05-20 MED ORDER — INSULIN GLARGINE-YFGN 100 UNIT/ML ~~LOC~~ SOLN
10.0000 [IU] | Freq: Once | SUBCUTANEOUS | Status: AC
  Administered 2023-05-20: 10 [IU] via SUBCUTANEOUS
  Filled 2023-05-20: qty 0.1

## 2023-05-20 MED ORDER — LIVING WELL WITH DIABETES BOOK
Freq: Once | Status: AC
  Filled 2023-05-20: qty 1

## 2023-05-20 MED ORDER — PREDNISONE 20 MG PO TABS
40.0000 mg | ORAL_TABLET | Freq: Every day | ORAL | Status: DC
  Administered 2023-05-21: 40 mg via ORAL
  Filled 2023-05-20: qty 2

## 2023-05-20 MED ORDER — FOLIC ACID 1 MG PO TABS
1.0000 mg | ORAL_TABLET | Freq: Every day | ORAL | Status: DC
  Administered 2023-05-21: 1 mg via ORAL
  Filled 2023-05-20: qty 1

## 2023-05-20 NOTE — TOC Benefit Eligibility Note (Signed)
Patient Product/process development scientist completed.    The patient is insured through [facility]. Patient has [employer], may use a copay card, and/or apply for patient assistance if available.    Ran test claim for Semglee Pen and the current 30 day co-pay is $75.00.  Ran test claim for Novolog FlexPen and the current 30 day co-pay is $75.00.  This test claim was processed through [facility]- copay amounts may vary at other pharmacies due to pharmacy/plan contracts, or as the patient moves through the different stages of their insurance plan.     Roland Earl, CPHT Pharmacy Patient Advocate Specialist [facility] Patient Advocate Team Direct Number: [phone]  Fax: [phone]

## 2023-05-20 NOTE — Inpatient Diabetes Management (Signed)
Inpatient Diabetes Program Recommendations  AACE/ADA: New Consensus Statement on Inpatient Glycemic Control (2015)  Target Ranges:  Prepandial:   less than 140 mg/dL      Peak postprandial:   less than 180 mg/dL (1-2 hours)      Critically ill patients:  140 - 180 mg/dL   Lab Results  Component Value Date   GLUCAP 330 (H) 05/20/2023   HGBA1C 9.5 (H) 05/17/2023    Review of Glycemic Control  Latest Reference Range & Units 05/19/23 19:48 05/19/23 23:51 05/20/23 04:06 05/20/23 08:46  Glucose-Capillary 70 - 99 mg/dL 161 (H) 096 (H) 045 (H) 330 (H)  (H): Data is abnormally high  Diabetes history: DM2 Outpatient Diabetes medications: Janumet 50-1000 mg BID Current orders for Inpatient glycemic control: Semglee 15 units every day, Novolog 0-20 units Q4H, Solumedrol 40 mg Q12H  Inpatient Diabetes Program Recommendations:    Glucose increased with steroids.  Please consider,  Semglee 15 units BID while receiving steroids.    Will continue to follow while inpatient.  Thank you, Dulce Sellar, MSN, CDCES Diabetes Coordinator Inpatient Diabetes Program [phone] (team pager from 8a-5p)

## 2023-05-20 NOTE — Hospital Course (Addendum)
80 yom w/ history of ulcerative colitis for which he takes medication daily and is followed by the [facility] will last colonoscopy in November presented to ER after feeling unwell for the last several days to weeks.  He has been compliant with his last few doses of IV iron treatment.  He was having peach black jellylike melena with over 12 requiring subsequently over the last 24 hours PTA and was acutely weak, also complaining of epigastric pain. In the ER he was confirmed to be hypotensive and was tachycardic.  His hemoglobin was 5.8 from a baseline of 12.7 this time last year, but with more recent CBC not available in our system.  BUN was 38.  Stool was guaiac positive.  CTa of the abdomen noted varices cirrhosis and duodenitis but no active GI bleeding.  Protonix and octreotide drips were initiated, 2 units PRBC ordered [facility] contacted and admitted 05/16/23. Patient further had hypotension and pulmonary critical care was called to the bedside on 05/17/23 -systolic blood pressure down in the 70s,tachycardic 107. Due to hypotension pulmonary critical care was called to the bedside. He had 1 unit of packed cells second units with the reaction of fever 102 and a white count jumping to 23. Then had second unit of packed cells going after being treated with steroids and Benadryl, transferred to the intensive care unit for further evaluation and treatment. Significant events as listed below:  8/5 admitted for anemia GI bleed 8/6 transferred to ICU after going to floor for persistent hypotension, worsening anemia, bedside EGD and CTA without active bleed 8/7 flex sig without active bleed but with presence of coffee-ground material 8/8 colonoscopy.

## 2023-05-20 NOTE — Progress Notes (Signed)
Darren [name] 3:06 PM  Subjective: Patient seen and examined and case discussed with his wife as well as partner Dr. Levora Angel and his previous workup was reviewed and he has not had any more bleeding with the wife is concerned about his low blood count and we answered all their questions to include going back to work follow-up iron infusions etc. and he has no other complaints but they also had questions about his glucose and his sugars and his diabetic medicine  Objective: Vital signs stable afebrile no acute distress abdomen is soft nontender BUN and creatinine normal hemoglobin 7.9 with an MCV of 73 platelet count 106 INR slight increase 1.4  Assessment: Seemingly resolved GI blood loss questionably from cecal ulcer versus colitis  Plan: Okay with me to go home soon consider 1 more unit of blood or a dose of IV iron before discharge and will need close outpatient follow-up and will check on tomorrow if still here and I answered all of the patient and his wife's questions  [facility]  office [phone] After 5PM or if no answer call [phone]

## 2023-05-20 NOTE — Plan of Care (Signed)
  Problem: Education: Goal: Knowledge of General Education information will improve Description: Including pain rating scale, medication(s)/side effects and non-pharmacologic comfort measures Outcome: Progressing   Problem: Health Behavior/Discharge Planning: Goal: Ability to manage health-related needs will improve Outcome: Progressing   Problem: Clinical Measurements: Goal: Ability to maintain clinical measurements within normal limits will improve Outcome: Progressing Goal: Will remain free from infection Outcome: Progressing Goal: Diagnostic test results will improve Outcome: Progressing Goal: Respiratory complications will improve Outcome: Progressing Goal: Cardiovascular complication will be avoided Outcome: Progressing   Problem: Activity: Goal: Risk for activity intolerance will decrease Outcome: Progressing   Problem: Nutrition: Goal: Adequate nutrition will be maintained Outcome: Progressing   Problem: Coping: Goal: Level of anxiety will decrease Outcome: Progressing   Problem: Elimination: Goal: Will not experience complications related to bowel motility Outcome: Progressing Goal: Will not experience complications related to urinary retention Outcome: Progressing   Problem: Pain Managment: Goal: General experience of comfort will improve Outcome: Progressing   Problem: Safety: Goal: Ability to remain free from injury will improve Outcome: Progressing   Problem: Skin Integrity: Goal: Risk for impaired skin integrity will decrease Outcome: Progressing   Problem: Education: Goal: Ability to describe self-care measures that may prevent or decrease complications (Diabetes Survival Skills Education) will improve Outcome: Progressing Goal: Individualized Educational Video(s) Outcome: Progressing   Problem: Coping: Goal: Ability to adjust to condition or change in health will improve Outcome: Progressing   Problem: Fluid Volume: Goal: Ability to  maintain a balanced intake and output will improve Outcome: Progressing   Problem: Health Behavior/Discharge Planning: Goal: Ability to identify and utilize available resources and services will improve Outcome: Progressing Goal: Ability to manage health-related needs will improve Outcome: Progressing   Problem: Metabolic: Goal: Ability to maintain appropriate glucose levels will improve Outcome: Progressing   Problem: Nutritional: Goal: Maintenance of adequate nutrition will improve Outcome: Progressing Goal: Progress toward achieving an optimal weight will improve Outcome: Progressing   Problem: Skin Integrity: Goal: Risk for impaired skin integrity will decrease Outcome: Progressing   Problem: Tissue Perfusion: Goal: Adequacy of tissue perfusion will improve Outcome: Progressing   Problem: Activity: Goal: Ability to tolerate increased activity will improve Outcome: Progressing   Problem: Respiratory: Goal: Ability to maintain a clear airway and adequate ventilation will improve Outcome: Progressing   Problem: Role Relationship: Goal: Method of communication will improve Outcome: Progressing   

## 2023-05-20 NOTE — Progress Notes (Signed)
PROGRESS NOTE Darren [name]  YNW:[id number] DOB: 30-May-1961 DOA: 05/16/2023 PCP: Alysia Penna, MD  Brief Narrative/Hospital Course: 89 yom w/ history of ulcerative colitis for which he takes medication daily and is followed by the [facility] will last colonoscopy in November presented to ER after feeling unwell for the last several days to weeks.  He has been compliant with his last few doses of IV iron treatment.  He was having peach black jellylike melena with over 12 requiring subsequently over the last 24 hours PTA and was acutely weak, also complaining of epigastric pain. In the ER he was confirmed to be hypotensive and was tachycardic.  His hemoglobin was 5.8 from a baseline of 12.7 this time last year, but with more recent CBC not available in our system.  BUN was 38.  Stool was guaiac positive.  CTa of the abdomen noted varices cirrhosis and duodenitis but no active GI bleeding.  Protonix and octreotide drips were initiated, 2 units PRBC ordered [facility] contacted and admitted 05/16/23. Patient further had hypotension and pulmonary critical care was called to the bedside on 05/17/23 -systolic blood pressure down in the 70s,tachycardic 107. Due to hypotension pulmonary critical care was called to the bedside. He had 1 unit of packed cells second units with the reaction of fever 102 and a white count jumping to 23. Then had second unit of packed cells going after being treated with steroids and Benadryl, transferred to the intensive care unit for further evaluation and treatment. Significant events as listed below:  8/5 admitted for anemia GI bleed 8/6 transferred to ICU after going to floor for persistent hypotension, worsening anemia, bedside EGD and CTA without active bleed 8/7 flex sig without active bleed but with presence of coffee-ground material 8/8 colonoscopy.  Subjective: Seen and examined Resting well States stool is lighter No new complaints   Assessment and  Plan: Principal Problem:   Acute GI bleeding Active Problems:   Gastrointestinal hemorrhage with melena   Symptomatic anemia   Hemorrhagic shock (HCC)  Acute blood loss anemia due to lower GI bleed Hemorrhagic shock due to GI bleed CT scan concerning for duodenitis: Underwent bedside EGD, CTA on 8/6, flex sigmoidoscopy on 8/7 and colonoscopy on 8/8. ?  IBD component less likely PUD, varices based on EGD less likely AVM based on CT angio.  GI following closely> placed on systemic steroids: Discussed today okay to wean down to 40 mg daily and to taper every 4 weeks.Hemoglobin has been stable, stool is getting lighter and brownish. So far 6 units PRBC 8 units FFP 1 unit cryoprecipitate and 1 unit platelet transfused. Continue PPI,rocephin  as per GI. Trend serial H&H  and hold antihypertensives.  New onset Diabetes mellitus with uncontrolled hyperglycemia: New diagnosis,hba1c in 9s.Also on steroid.Needed 15 semgle + 40 u aspart 05/19/23, and 15u semglee + 26 u aspart 8/9- will increase Semglee 25 u daily, tapering down steroid to 40 mg daily.DM coordinator following, will need diabetes/hypoglycemia education prior to discharge monitor and adjust.  Thrombocytopenia: appears somewhat chronic fluctuating, trend.  Suspect in the setting of liver cirrhosis Recent Labs  Lab 05/18/23 1554 05/18/23 2139 05/19/23 0331 05/19/23 1158 05/20/23 0748  PLT 102* 103* 105* 103* 106*    Liver cirrhosis History of alcohol abuse: Advised cessation, continue folic acid vitamins.  Gout: continue allopurinol.  Class I Obesity: Body mass index is 36.51 kg/m. : Will benefit with PCP follow-up, weight loss  healthy lifestyle and outpatient sleep evaluation.  DVT prophylaxis: SCDs Start: 05/16/23 2012 Code Status:   Code Status: Full Code Family Communication: plan of care discussed with patient at bedside. Patient status is:  inpatient because of hyperglycemia and gi bleeding.  Level of care: Med-Surg   Dispo: The patient is from:Home            Anticipated disposition: Home in 24 hrs if hb and sugar stable  Objective: Vitals last 24 hrs: Vitals:   05/19/23 2022 05/19/23 2352 05/20/23 0409 05/20/23 0847  BP: 119/66 127/82 108/73 104/70  Pulse: 76 69 65 64  Resp: 18 16 16 18   Temp: 98.1 F (36.7 C) 98.6 F (37 C) 97.9 F (36.6 C) 98 F (36.7 C)  TempSrc: Oral Oral Oral   SpO2: 100% 100% 98% 99%  Weight:      Height:       Weight change:   Physical Examination: General exam: alert awake, older than stated age HEENT:Oral mucosa moist, Ear/Nose WNL grossly Respiratory system: bilaterally clear BS, no use of accessory muscle Cardiovascular system: S1 & S2 +, No JVD. Gastrointestinal system: Abdomen soft,NT,ND, BS+ Nervous System:Alert, awake, moving extremities. Extremities: LE edema neg,distal peripheral pulses palpable.  Skin: No rashes,no icterus. MSK: Normal muscle bulk,tone, power  Medications reviewed:  Scheduled Meds:  sodium chloride   Intravenous Once   sodium chloride   Intravenous Once   sodium chloride   Intravenous Once   allopurinol  300 mg Oral Daily   Chlorhexidine Gluconate Cloth  6 each Topical Daily   folic acid  1 mg Intravenous Daily   insulin aspart  0-20 Units Subcutaneous Q4H   insulin glargine-yfgn  10 Units Subcutaneous Once   [START ON 05/21/2023] insulin glargine-yfgn  25 Units Subcutaneous Daily   pantoprazole (PROTONIX) IV  40 mg Intravenous Q12H   [START ON 05/21/2023] predniSONE  40 mg Oral Q breakfast   sodium phosphate  1 enema Rectal Once  Continuous Infusions:  cefTRIAXone (ROCEPHIN)  IV Stopped (05/19/23 1623)   sodium chloride      Diet Order             DIET SOFT Room service appropriate? Yes; Fluid consistency: Thin  Diet effective now                  Intake/Output Summary (Last 24 hours) at 05/20/2023 1309 Last data filed at 05/20/2023 0849 Gross per 24 hour  Intake 840 ml  Output --  Net 840 ml   Net IO Since  Admission: 8,604.67 mL [05/20/23 1309]  Wt Readings from Last 3 Encounters:  05/16/23 122.1 kg  05/24/22 122.1 kg  06/07/18 121.5 kg     Unresulted Labs (From admission, onward)    None     Data Reviewed: I have personally reviewed following labs and imaging studies CBC: Recent Labs  Lab 05/18/23 1554 05/18/23 2139 05/19/23 0331 05/19/23 1158 05/20/23 0748  WBC 6.9 6.6 6.5 5.2 5.4  HGB 7.6* 7.4* 7.7* 7.7* 7.9*  HCT 25.6* 25.7* 26.0* 27.3* 27.3*  MCV 74.2* 73.4* 72.8* 75.8* 73.4*  PLT 102* 103* 105* 103* 106*   Basic Metabolic Panel: Recent Labs  Lab 05/16/23 1307 05/17/23 0133 05/17/23 1757 05/18/23 0028 05/19/23 0331 05/20/23 0748  NA 134* 135 140 138 138 136  K 5.0 4.6 4.2 3.7 3.4* 4.2  CL 101 102  --  107 105 101  CO2 20* 19*  --  25 27 25   GLUCOSE 492* 427*  --  233* 158* 322*  BUN  38* 33*  --  21 14 16   CREATININE 0.90 0.95  --  0.74 0.89 0.78  CALCIUM 9.0 8.6*  --  8.2* 8.2* 8.2*   GFR: Estimated Creatinine Clearance: 125.9 mL/min (by C-G formula based on SCr of 0.78 mg/dL). Liver Function Tests: Recent Labs  Lab 05/16/23 1307 05/17/23 0133 05/18/23 0028  AST 29 33 35  ALT 29 30 36  ALKPHOS 55 52 42  BILITOT 1.4* 1.2 1.9*  PROT 5.0* 5.0* 5.1*  ALBUMIN 2.9* 3.0* 2.8*   Recent Labs  Lab 05/16/23 1600  LIPASE 40   Recent Results (from the past 240 hour(s))  Culture, blood (Routine X 2) w Reflex to ID Panel     Status: None (Preliminary result)   Collection Time: 05/17/23  4:11 AM   Specimen: BLOOD  Result Value Ref Range Status   Specimen Description BLOOD BLOOD LEFT HAND  Final   Special Requests AEROBIC BOTTLE ONLY Blood Culture adequate volume  Final   Culture   Final    NO GROWTH 3 DAYS Performed at [facility], [address] [place], [place] [address]    Report Status PENDING  Incomplete  Culture, blood (Routine X 2) w Reflex to ID Panel     Status: None (Preliminary result)   Collection Time: 05/17/23  6:11 AM    Specimen: BLOOD  Result Value Ref Range Status   Specimen Description BLOOD RIGHT ANTECUBITAL  Final   Special Requests   Final    BOTTLES DRAWN AEROBIC AND ANAEROBIC Blood Culture adequate volume   Culture   Final    NO GROWTH 3 DAYS Performed at [facility], [address] [place], [place] [address]    Report Status PENDING  Incomplete  SARS Coronavirus 2 by RT PCR (hospital order, performed in [facility] hospital lab) *cepheid single result test* Anterior Nasal Swab     Status: None   Collection Time: 05/17/23  6:28 AM   Specimen: Anterior Nasal Swab  Result Value Ref Range Status   SARS Coronavirus 2 by RT PCR NEGATIVE NEGATIVE Final    Comment: Performed at [facility], [address] [place], [place] [address]  Respiratory (~20 pathogens) panel by PCR     Status: None   Collection Time: 05/17/23  6:28 AM   Specimen: Nasopharyngeal Swab; Respiratory  Result Value Ref Range Status   Adenovirus NOT DETECTED NOT DETECTED Final   Coronavirus 229E NOT DETECTED NOT DETECTED Final    Comment: (NOTE) The Coronavirus on the Respiratory Panel, DOES NOT test for the novel  Coronavirus (2019 nCoV)    Coronavirus HKU1 NOT DETECTED NOT DETECTED Final   Coronavirus NL63 NOT DETECTED NOT DETECTED Final   Coronavirus OC43 NOT DETECTED NOT DETECTED Final   Metapneumovirus NOT DETECTED NOT DETECTED Final   Rhinovirus / Enterovirus NOT DETECTED NOT DETECTED Final   Influenza A NOT DETECTED NOT DETECTED Final   Influenza B NOT DETECTED NOT DETECTED Final   Parainfluenza Virus 1 NOT DETECTED NOT DETECTED Final   Parainfluenza Virus 2 NOT DETECTED NOT DETECTED Final   Parainfluenza Virus 3 NOT DETECTED NOT DETECTED Final   Parainfluenza Virus 4 NOT DETECTED NOT DETECTED Final   Respiratory Syncytial Virus NOT DETECTED NOT DETECTED Final   Bordetella pertussis NOT DETECTED NOT DETECTED Final   Bordetella Parapertussis NOT DETECTED NOT DETECTED Final   Chlamydophila pneumoniae  NOT DETECTED NOT DETECTED Final   Mycoplasma pneumoniae NOT DETECTED NOT DETECTED Final  Comment: Performed at [facility], [address] [place], [place] [address]  MRSA Next Gen by PCR, Nasal     Status: None   Collection Time: 05/17/23  9:09 AM   Specimen: Nasal Mucosa; Nasal Swab  Result Value Ref Range Status   MRSA by PCR Next Gen NOT DETECTED NOT DETECTED Final    Comment: (NOTE) The GeneXpert MRSA Assay (FDA approved for NASAL specimens only), is one component of a comprehensive MRSA colonization surveillance program. It is not intended to diagnose MRSA infection nor to guide or monitor treatment for MRSA infections. Test performance is not FDA approved in patients less than 54 years old. Performed at [facility], [address], [place], [place] [address]     Antimicrobials: Anti-infectives (From admission, onward)    Start     Dose/Rate Route Frequency Ordered Stop   05/17/23 1600  cefTRIAXone (ROCEPHIN) 1 g in sodium chloride 0.9 % 100 mL IVPB  Status:  Discontinued        1 g 200 mL/hr over 30 Minutes Intravenous Every 24 hours 05/16/23 1956 05/17/23 0829   05/17/23 1600  cefTRIAXone (ROCEPHIN) 2 g in sodium chloride 0.9 % 100 mL IVPB        2 g 200 mL/hr over 30 Minutes Intravenous Every 24 hours 05/17/23 0829 05/23/23 1559   05/17/23 0915  metroNIDAZOLE (FLAGYL) IVPB 500 mg  Status:  Discontinued        500 mg 100 mL/hr over 60 Minutes Intravenous Every 12 hours 05/17/23 0829 05/19/23 0909   05/16/23 2000  cefTRIAXone (ROCEPHIN) 1 g in sodium chloride 0.9 % 100 mL IVPB  Status:  Discontinued        1 g 200 mL/hr over 30 Minutes Intravenous Every 24 hours 05/16/23 1956 05/16/23 1956   05/16/23 1600  cefTRIAXone (ROCEPHIN) 1 g in sodium chloride 0.9 % 100 mL IVPB        1 g 200 mL/hr over 30 Minutes Intravenous  Once 05/16/23 1550 05/17/23 0205      Culture/Microbiology    Component Value Date/Time   SDES BLOOD RIGHT ANTECUBITAL 05/17/2023 0611    SPECREQUEST  05/17/2023 0611    BOTTLES DRAWN AEROBIC AND ANAEROBIC Blood Culture adequate volume   CULT  05/17/2023 0611    NO GROWTH 3 DAYS Performed at [facility], [address] [place], [place] [address]    REPTSTATUS PENDING 05/17/2023 9147  Other culture-see note   Radiology Studies: No results found.  LOS: 4 days  Lanae Boast, MD [facility]  05/20/2023, 1:09 PM

## 2023-05-20 NOTE — Progress Notes (Signed)
Physical Therapy Treatment Patient Details Name: Darren [name] MRN: [id number] DOB: [birth year] Today's Date: 05/20/2023   History of Present Illness 62 year old male admitted 8/6  presenting with 2 days of bloody stools hemoglobin down to 6.1 and a systolic blood pressure down in the 70s. He is tachycardic 107.  He had 1 unit of packed cells second units with the reaction of fever 102 and a white count jumping to 23. He received a second unit of packed cells with Hgb still low. . Transfer to ICU.  EGD unremarkable on 8/6.  Flexible sigmoidoscopy 8/7.  PMH:  ulcerative colitis for which he takes medication daily and is followed by the [facility] will last colonoscopy in November.    PT Comments  Pt greeted resting in bed and agreeable to session with continues progress towards acute goals. Pt demonstrating some impulsivity and decreased safety awareness throughout session, attempting to doff socks in standing and generally moving hastily, educated pt on safety awareness and fall prevention strategies with pt verbalizing understanding and improving throughout session. Pt performing gait for hallway distance without AD support and grossly supervision assist for safety with pt demonstrating some drift R/L but no overt LOB. Pt able to ascend/descend steps in stairwell with cues for step-to pattern for safety as pt demonstrating impaired balance catching front of sandal x2 with step-over step pattern. Pt continues to benefit from skilled PT services to progress toward functional mobility goals.     If plan is discharge home, recommend the following:     Can travel by private vehicle        Equipment Recommendations  None recommended by PT    Recommendations for Other Services       Precautions / Restrictions Precautions Precautions: Fall Restrictions Weight Bearing Restrictions: No     Mobility  Bed Mobility Overal bed mobility: Independent                   Transfers Overall transfer level: Needs assistance Equipment used: None Transfers: Sit to/from Stand Sit to Stand: Supervision           General transfer comment: supervision for safety    Ambulation/Gait Ambulation/Gait assistance: Supervision Gait Distance (Feet): 600 Feet Assistive device: None Gait Pattern/deviations: Step-through pattern, Decreased stride length, Wide base of support       General Gait Details: Pt able to ambulate with suervision assist on unit without LOB with mild challenges, noted drfit R/L   Stairs Stairs: Yes Stairs assistance: Contact guard assist Stair Management: One rail Right, One rail Left, Alternating pattern, Step to pattern, Forwards Number of Stairs: 12 (+ 5) General stair comments: up down with alernating pattern initially with pt catching sandal on step edge x2, encouraraged and educated pt on step-to pattern with pt able to ascend/descend 5 steps without fault   Wheelchair Mobility     Tilt Bed    Modified Rankin (Stroke Patients Only)       Balance Overall balance assessment: No apparent balance deficits (not formally assessed)                                          Cognition Arousal: Alert Behavior During Therapy: WFL for tasks assessed/performed Overall Cognitive Status: Within Functional Limits for tasks assessed  General Comments: pt needing cues to slow and for safety awareness as pt with some impulsivity attempting to dof socks in standing with noted instability        Exercises      General Comments General comments (skin integrity, edema, etc.): VSS on RA, pt spouse at bedside and supportive      Pertinent Vitals/Pain Pain Assessment Pain Assessment: No/denies pain    Home Living                          Prior Function            PT Goals (current goals can now be found in the care plan section) Acute Rehab PT  Goals Patient Stated Goal: to go home PT Goal Formulation: With patient Time For Goal Achievement: 06/01/23 Progress towards PT goals: Progressing toward goals    Frequency    Min 1X/week      PT Plan      Co-evaluation              AM-PAC PT "6 Clicks" Mobility   Outcome Measure  Help needed turning from your back to your side while in a flat bed without using bedrails?: None Help needed moving from lying on your back to sitting on the side of a flat bed without using bedrails?: None Help needed moving to and from a bed to a chair (including a wheelchair)?: A Little Help needed standing up from a chair using your arms (e.g., wheelchair or bedside chair)?: A Little Help needed to walk in hospital room?: A Little Help needed climbing 3-5 steps with a railing? : A Little 6 Click Score: 20    End of Session Equipment Utilized During Treatment: Gait belt Activity Tolerance: Patient tolerated treatment well Patient left: in chair;with call bell/phone within reach;with family/visitor present Nurse Communication: Mobility status PT Visit Diagnosis: Muscle weakness (generalized) (M62.81)     Time: 1610-9604 PT Time Calculation (min) (ACUTE ONLY): 14 min  Charges:    $Gait Training: 8-22 mins PT General Charges $$ ACUTE PT VISIT: 1 Visit                     Tobi Bastos R. PTA Acute Rehabilitation Services Office: [phone]   Catalina Antigua 05/20/2023, 1:21 PM

## 2023-05-20 NOTE — Inpatient Diabetes Management (Signed)
Inpatient Diabetes Program Recommendations  AACE/ADA: New Consensus Statement on Inpatient Glycemic Control (2015)  Target Ranges:  Prepandial:   less than 140 mg/dL      Peak postprandial:   less than 180 mg/dL (1-2 hours)      Critically ill patients:  140 - 180 mg/dL   Lab Results  Component Value Date   GLUCAP 277 (H) 05/20/2023   HGBA1C 9.5 (H) 05/17/2023    Review of Glycemic Control  Latest Reference Range & Units 05/19/23 23:51 05/20/23 04:06 05/20/23 08:46 05/20/23 12:09  Glucose-Capillary 70 - 99 mg/dL 098 (H)  Novolog 15 units 278 (H) 330 (H)  Novolog 15 units 277 (H)  Novolog 11 units  (H): Data is abnormally high  Diabetes history: DM2 Outpatient Diabetes medications: Janumet 50-1000 mg BID Current orders for Inpatient glycemic control:  Semglee 25 units every day Novolog 0-20 units Q4H Solumedrol 40 mg Q12H  Inpatient Diabetes Program Recommendations:    Novolog 0-20 units TID and 0-5 units at bedtime Novolog 8 units TID with meals if he consumes at least 50%    Discharge Recommendations: Other recommendations: Doses may need to be adjusted at discharge.  Wanted to place recs so MD knows which insulins are preferred. Long acting recommendations: Insulin Glargine-yfgn (SEMGLEE) Pen 25 units QHS  Short acting recommendations:  Meal coverage ONLY Insulin aspart (NOVOLOG) FlexPen  Novolog 10 units TID with meals   Supply/Referral recommendations: Glucometer Test strips Lancet device Lancets Pen needles - standard   Use Adult Diabetes Insulin Treatment Post Discharge order set.   Spoke with patient and spouse at bedside.  He is very reluctant to start insulin at home.  We had a lengthy conversion about diabetes, steroids, insulin resistance etc.  He states he will make lifestyle changes; he will exercise, limit carbs, and stop drinking wine.    He has never actually been told he has diabetes.  He was started on Janumet 50-1000 mg approximately 8  months ago.  He does not check his BG nor does he have a glucometer.  Reviewed patient's current A1c of 9.5% (average BG of 225 mg/dL). Explained what a A1c is and what it measures. Also reviewed goal A1c with patient, importance of good glucose control @ home, and blood sugar goals.  Ordered the LWWD booklet-RN's please be sure to give this to him prior to DC.  Also ordered the insulin pen starter kit.    After a long discussion, he is willing to DC on insulin while he is taking steroids.  He states after the steroids he is stopping the insulin.  Educated patient on insulin pen use at home. Reviewed contents of insulin flexpen starter kit. Reviewed all steps of insulin pen including attachment of needle, 2-unit air shot, dialing up dose, giving injection, removing needle, disposal of sharps, storage of unused insulin, disposal of insulin etc. Patient able to provide successful return demonstration. Also reviewed troubleshooting with insulin pen. MD to give patient Rxs for insulin pens and insulin pen needles.  He would benefit from a Freestyle Libre 3 CGM to help him monitor his blood glucose.  Permission given by MD to place a FSL3.  Placed to the back of his left arm by his spouse.  Educated them on the CGM.  Gave them an extra to take home for when this one expires in 14 days.    Educated on hypoglycemia, signs, symptoms and treatments.    Will continue to follow while inpatient.  Thank you, Candise Bowens  Hyacinth Meeker, MSN, CDCES Diabetes Coordinator Inpatient Diabetes Program [phone] (team pager from 8a-5p)

## 2023-05-21 ENCOUNTER — Other Ambulatory Visit (HOSPITAL_COMMUNITY): Payer: Self-pay

## 2023-05-21 ENCOUNTER — Encounter: Payer: Self-pay | Admitting: Hematology and Oncology

## 2023-05-21 DIAGNOSIS — K922 Gastrointestinal hemorrhage, unspecified: Secondary | ICD-10-CM | POA: Diagnosis not present

## 2023-05-21 LAB — GLUCOSE, CAPILLARY
Glucose-Capillary: 150 mg/dL — ABNORMAL HIGH (ref 70–99)
Glucose-Capillary: 228 mg/dL — ABNORMAL HIGH (ref 70–99)
Glucose-Capillary: 241 mg/dL — ABNORMAL HIGH (ref 70–99)

## 2023-05-21 MED ORDER — INSULIN ASPART 100 UNIT/ML FLEXPEN
5.0000 [IU] | PEN_INJECTOR | Freq: Three times a day (TID) | SUBCUTANEOUS | 0 refills | Status: AC
  Filled 2023-05-21: qty 6, 40d supply, fill #0

## 2023-05-21 MED ORDER — FOLIC ACID 1 MG PO TABS
1.0000 mg | ORAL_TABLET | Freq: Every day | ORAL | 0 refills | Status: AC
  Filled 2023-05-21: qty 30, 30d supply, fill #0

## 2023-05-21 MED ORDER — PANTOPRAZOLE SODIUM 40 MG PO TBEC: 40.0000 mg | DELAYED_RELEASE_TABLET | Freq: Two times a day (BID) | ORAL | Status: DC

## 2023-05-21 MED ORDER — PANTOPRAZOLE SODIUM 40 MG PO TBEC
40.0000 mg | DELAYED_RELEASE_TABLET | Freq: Two times a day (BID) | ORAL | 0 refills | Status: AC
Start: 2023-05-21 — End: 2023-06-20
  Filled 2023-05-21: qty 60, 30d supply, fill #0

## 2023-05-21 MED ORDER — BLOOD GLUCOSE TEST VI STRP
1.0000 | ORAL_STRIP | Freq: Three times a day (TID) | 0 refills | Status: AC
  Filled 2023-05-21: qty 100, 30d supply, fill #0

## 2023-05-21 MED ORDER — ONETOUCH DELICA PLUS LANCET30G MISC
1.0000 | Freq: Three times a day (TID) | 0 refills | Status: AC
  Filled 2023-05-21: qty 100, 30d supply, fill #0

## 2023-05-21 MED ORDER — INSULIN PEN NEEDLE 32G X 4 MM MISC
1.0000 | Freq: Three times a day (TID) | 0 refills | Status: AC
  Filled 2023-05-21: qty 100, 30d supply, fill #0

## 2023-05-21 MED ORDER — INSULIN ASPART 100 UNIT/ML IJ SOLN
0.0000 [IU] | Freq: Three times a day (TID) | INTRAMUSCULAR | Status: DC
  Administered 2023-05-21: 2 [IU] via SUBCUTANEOUS

## 2023-05-21 MED ORDER — INSULIN GLARGINE-YFGN 100 UNIT/ML ~~LOC~~ SOPN
30.0000 [IU] | PEN_INJECTOR | Freq: Every day | SUBCUTANEOUS | 0 refills | Status: AC
  Filled 2023-05-21: qty 9, 30d supply, fill #0

## 2023-05-21 MED ORDER — LANCET DEVICE MISC
1.0000 | Freq: Three times a day (TID) | 0 refills | Status: AC
  Filled 2023-05-21: qty 1, fill #0

## 2023-05-21 MED ORDER — INSULIN GLARGINE-YFGN 100 UNIT/ML ~~LOC~~ SOLN
35.0000 [IU] | Freq: Every day | SUBCUTANEOUS | Status: DC
  Filled 2023-05-21: qty 0.35

## 2023-05-21 MED ORDER — PREDNISONE 10 MG PO TABS
ORAL_TABLET | ORAL | 0 refills | Status: AC
  Filled 2023-05-21: qty 70, 28d supply, fill #0

## 2023-05-21 MED ORDER — INSULIN GLARGINE-YFGN 100 UNIT/ML ~~LOC~~ SOLN
25.0000 [IU] | Freq: Every day | SUBCUTANEOUS | Status: DC
  Administered 2023-05-21: 25 [IU] via SUBCUTANEOUS
  Filled 2023-05-21: qty 0.25

## 2023-05-21 MED ORDER — BLOOD GLUCOSE MONITOR SYSTEM W/DEVICE KIT
1.0000 | PACK | Freq: Three times a day (TID) | 0 refills | Status: AC
  Filled 2023-05-21: qty 1, 30d supply, fill #0

## 2023-05-21 NOTE — Progress Notes (Signed)
Patient requesting short term disability paperwork to be filled out. MD told patient that his PCP needs to fill this out. I educated the patient that the admission diagnosis for the hospital is an acute problem that has resolved, and just needs to be managed at home. Educated that PCP would need to evaluate both acute and chronic diagnosis to make a determination for short term disability. And this is why the PCP would be the doctor to fill out the paperwork. Provided patient with work notes filled out by MD.

## 2023-05-21 NOTE — Progress Notes (Signed)
Darren [name] 9:21 AM  Subjective: Patient doing fine without any signs of bleeding wants to go home no new complaints we answered all of his questions I reviewed his office computer chart and previous workup as well  Objective: Vital signs stable afebrile no acute distress abdomen is soft nontender BUN and creatinine okay hemoglobin stable  Assessment: Ulcerative colitis and cirrhosis  Plan: Discussed stopping alcohol we discussed exercise and diet he needs to talk to his primary doctor or endocrinologist consult regarding Ozempic or Wegovy we also talked about no aspirin and nonsteroidals more than 4 Tylenol a day and he was helped a lot in the past by IV iron and either Dr. Dulce Sellar or his primary care doctor however to can set that up and I have let Dr. Hulen Shouts nurse know he needs repeat labs next week please call me if I can be of any further assistance with this hospital stay [facility]  office [phone] After 5PM or if no answer call [phone]

## 2023-05-21 NOTE — Plan of Care (Signed)
  Problem: Education: Goal: Knowledge of General Education information will improve Description: Including pain rating scale, medication(s)/side effects and non-pharmacologic comfort measures Outcome: Progressing   Problem: Health Behavior/Discharge Planning: Goal: Ability to manage health-related needs will improve Outcome: Progressing   Problem: Clinical Measurements: Goal: Ability to maintain clinical measurements within normal limits will improve Outcome: Progressing Goal: Will remain free from infection Outcome: Progressing Goal: Diagnostic test results will improve Outcome: Progressing Goal: Respiratory complications will improve Outcome: Progressing Goal: Cardiovascular complication will be avoided Outcome: Progressing   Problem: Activity: Goal: Risk for activity intolerance will decrease Outcome: Progressing   Problem: Nutrition: Goal: Adequate nutrition will be maintained Outcome: Progressing   Problem: Coping: Goal: Level of anxiety will decrease Outcome: Progressing   Problem: Elimination: Goal: Will not experience complications related to bowel motility Outcome: Progressing Goal: Will not experience complications related to urinary retention Outcome: Progressing   Problem: Pain Managment: Goal: General experience of comfort will improve Outcome: Progressing   Problem: Safety: Goal: Ability to remain free from injury will improve Outcome: Progressing   Problem: Skin Integrity: Goal: Risk for impaired skin integrity will decrease Outcome: Progressing   Problem: Education: Goal: Ability to describe self-care measures that may prevent or decrease complications (Diabetes Survival Skills Education) will improve Outcome: Progressing Goal: Individualized Educational Video(s) Outcome: Progressing   Problem: Coping: Goal: Ability to adjust to condition or change in health will improve Outcome: Progressing   Problem: Fluid Volume: Goal: Ability to  maintain a balanced intake and output will improve Outcome: Progressing   Problem: Health Behavior/Discharge Planning: Goal: Ability to identify and utilize available resources and services will improve Outcome: Progressing Goal: Ability to manage health-related needs will improve Outcome: Progressing   Problem: Metabolic: Goal: Ability to maintain appropriate glucose levels will improve Outcome: Progressing   Problem: Nutritional: Goal: Maintenance of adequate nutrition will improve Outcome: Progressing Goal: Progress toward achieving an optimal weight will improve Outcome: Progressing   Problem: Skin Integrity: Goal: Risk for impaired skin integrity will decrease Outcome: Progressing   Problem: Tissue Perfusion: Goal: Adequacy of tissue perfusion will improve Outcome: Progressing   Problem: Activity: Goal: Ability to tolerate increased activity will improve Outcome: Progressing   Problem: Respiratory: Goal: Ability to maintain a clear airway and adequate ventilation will improve Outcome: Progressing   Problem: Role Relationship: Goal: Method of communication will improve Outcome: Progressing   

## 2023-05-21 NOTE — Discharge Summary (Signed)
Physician Discharge Summary  Darren [name] NWG:[id number] DOB: June 29, 1961 DOA: 05/16/2023  PCP: Alysia Penna, MD  Admit date: 05/16/2023 Discharge date: 05/21/2023 Recommendations for Outpatient Follow-up:  Follow up with PCP in 1 weeks-call for appointment Please obtain BMP/CBC in one week  Discharge Dispo: Home Discharge Condition: Stable Code Status:   Code Status: Full Code Diet recommendation:  Diet Order             Diet Carb Modified           DIET SOFT Room service appropriate? Yes; Fluid consistency: Thin  Diet effective now                    Brief/Interim Summary: 62 yom w/ history of ulcerative colitis for which he takes medication daily and is followed by the [facility] will last colonoscopy in November presented to ER after feeling unwell for the last several days to weeks.  He has been compliant with his last few doses of IV iron treatment.  He was having peach black jellylike melena with over 12 requiring subsequently over the last 24 hours PTA and was acutely weak, also complaining of epigastric pain. In the ER he was confirmed to be hypotensive and was tachycardic.  His hemoglobin was 5.8 from a baseline of 12.7 this time last year, but with more recent CBC not available in our system.  BUN was 38.  Stool was guaiac positive.  CTa of the abdomen noted varices cirrhosis and duodenitis but no active GI bleeding.  Protonix and octreotide drips were initiated, 2 units PRBC ordered [facility] contacted and admitted 05/16/23. Patient further had hypotension and pulmonary critical care was called to the bedside on 05/17/23 -systolic blood pressure down in the 70s,tachycardic 107. Due to hypotension pulmonary critical care was called to the bedside. He had 1 unit of packed cells second units with the reaction of fever 102 and a white count jumping to 23. Then had second unit of packed cells going after being treated with steroids and Benadryl, transferred to the intensive  care unit for further evaluation and treatment. Significant events as listed below:  8/5 admitted for anemia GI bleed 8/6 transferred to ICU after going to floor for persistent hypotension, worsening anemia, bedside EGD and CTA without active bleed 8/7 flex sig without active bleed but with presence of coffee-ground material 8/8 colonoscopy. Per GI question IBD component this likely PUD, varices based on EGD. Number bleeding hemoglobin had stabilized. Patient has been placed on a steroid which will be tapered over next 4 weeks, has follow-up arranged with GI as outpatient.  Continue PPI follow-up with PCP check CBC in 1 week Patient had new onset diabetes mellitus with uncontrolled hyperglycemia placed on insulin regimen basal bolus blood sugar fairly stabilized> but will need to monitor outpatient and insulin dosing adjusted-discussed hypoglycemia hyperglycemia how to recognize and how to manage.>  Was seen by DM coordinator. He feels well today and requesting for discharge home.  Seen by GI and cleared.  Discharge Diagnoses:  Principal Problem:   Acute GI bleeding Active Problems:   Gastrointestinal hemorrhage with melena   Symptomatic anemia   Hemorrhagic shock (HCC)  Acute blood loss anemia due to lower GI bleed Hemorrhagic shock due to GI bleed CT scan concerning for duodenitis: Underwent bedside EGD, CTA on 8/6, flex sigmoidoscopy on 8/7 and colonoscopy on 8/8. ?  IBD component less likely PUD, varices based on EGD less likely AVM based on CT angio.  GI following closely> placed on systemic steroids: Prednisone 40 mg daily and to taper OVER 4 weeks.hemoglobin uptrending, continue PPI.   Seen by GI and okay for discharge home  New onset Diabetes mellitus with uncontrolled hyperglycemia: New diagnosis,hba1c in 9s.Also on steroid.stress dose decreased from 40 twice daily to 40 daily, now on basal bolus regimen and patient will go home on that has been educated on insulin teaching,  diabetes mellitus, hypoglycemia hyperglycemia.  Reinforced that he needs to follow up blood sugar closely and follow-up with PCP to adjust insulin regimen especially given that he is on a steroid and is being tapered down will need his insulin regimen adjusted accordingly.    Thrombocytopenia: appears somewhat chronic fluctuating, trend.  Suspect in the setting of liver cirrhosis Recent Labs  Lab 05/18/23 2139 05/19/23 0331 05/19/23 1158 05/20/23 0748 05/21/23 0833  PLT 103* 105* 103* 106* 121*    Liver cirrhosis History of alcohol abuse: Advised cessation, continue folic acid vitamins.  Gout: continue allopurinol.  Class I Obesity: Body mass index is 36.51 kg/m. : Will benefit with PCP follow-up, weight loss  healthy lifestyle and outpatient sleep evaluation.    Consults: GI PCCM  Subjective: Alert awake oriented resting comfortably no nausea vomiting or GI bleeding  Discharge Exam: Vitals:   05/21/23 0259 05/21/23 0757  BP: 114/76 116/68  Pulse: (!) 56 (!) 56  Resp: 18 17  Temp: (!) 97.5 F (36.4 C) 98.4 F (36.9 C)  SpO2: 99% 98%   General: Pt is alert, awake, not in acute distress Cardiovascular: RRR, S1/S2 +, no rubs, no gallops Respiratory: CTA bilaterally, no wheezing, no rhonchi Abdominal: Soft, NT, ND, bowel sounds + Extremities: no edema, no cyanosis  Discharge Instructions  Discharge Instructions     Diet Carb Modified   Complete by: As directed    Discharge instructions   Complete by: As directed    Please call call MD or return to ER for similar or worsening recurring problem that brought you to hospital or if any fever,nausea/vomiting,abdominal pain, uncontrolled pain, chest pain,  shortness of breath or any other alarming symptoms.  Please follow-up your doctor as instructed in a week time and call the office for appointment.  Please avoid alcohol, smoking, or any other illicit substance and maintain healthy habits including taking your  regular medications as prescribed.  You were cared for by a hospitalist during your hospital stay. If you have any questions about your discharge medications or the care you received while you were in the hospital after you are discharged, you can call the unit and ask to speak with the hospitalist on call if the hospitalist that took care of you is not available.  Once you are discharged, your primary care physician will handle any further medical issues. Please note that NO REFILLS for any discharge medications will be authorized once you are discharged, as it is imperative that you return to your primary care physician (or establish a relationship with a primary care physician if you do not have one) for your aftercare needs so that they can reassess your need for medications and monitor your lab values   PRN stated drainage continues to decrease your insulin to currently decrease please follow-up with PCP to adjust blood sugar Check blood sugar 3 times a day and bedtime at home unless you have libre insulin continuous monitor. If blood sugar running above 200 less than 70 please call your MD to adjust insulin. If blood sugars running less 100  do not use insulin and call MD. If you noticed signs and symptoms of hypoglycemia or low blood sugar like jitteriness, confusion, thirst, tremor, sweating- Check blood sugar, drink sugary drink/biscuits/sweets to increase sugar level and call MD or return to ER.   Increase activity slowly   Complete by: As directed       Allergies as of 05/21/2023   No Known Allergies      Medication List     STOP taking these medications    ASPIRIN PO       TAKE these medications    albuterol 108 (90 Base) MCG/ACT inhaler Commonly known as: VENTOLIN HFA Inhale 2 puffs into the lungs every 6 (six) hours as needed.   allopurinol 300 MG tablet Commonly known as: ZYLOPRIM Take 1 tablet (300 mg total) by mouth daily.   folic acid 1 MG tablet Commonly  known as: FOLVITE Take 1 tablet (1 mg total) by mouth daily. Start taking on: May 22, 2023   Lancet Device Misc 1 each by Does not apply route 3 (three) times daily. May dispense any manufacturer covered by patient's insurance.   Mesalamine 800 MG Tbec Take 1 tablet by mouth daily.   NovoLOG FlexPen 100 UNIT/ML FlexPen Generic drug: insulin aspart Inject 5 Units into the skin 3 (three) times daily with meals. Only take if eating a meal AND Blood Glucose (BG) is 80 or higher.   OneTouch Delica Plus Lancet30G Misc 1 each 3 (three) times daily.   OneTouch Verio Flex System w/Device Kit Use 3 (three) times daily.   OneTouch Verio test strip Generic drug: glucose blood Use as directed to check blood sugar 3 (three) times daily. . May dispense any manufacturer covered by patient's insurance and fits patient's device.   pantoprazole 40 MG tablet Commonly known as: PROTONIX Take 1 tablet (40 mg total) by mouth 2 (two) times daily.   predniSONE 10 MG tablet Commonly known as: DELTASONE Take 4 tabs daily x 7 days , 3 tabs daily x7 days, 2 tabs daily x7 days, 1 tab daily x 7 days, then stop   Semglee (yfgn) 100 UNIT/ML Pen Generic drug: insulin glargine-yfgn Inject 30 Units into the skin daily. May substitute as needed per insurance.   TechLite Plus Pen Needles 32G X 4 MM Misc Generic drug: Insulin Pen Needle Use with insulin pens.   valsartan 160 MG tablet Commonly known as: DIOVAN Take 160 mg by mouth daily.        Follow-up Information     Alysia Penna, MD Follow up in 1 week(s).   Specialty: Internal Medicine Contact information: [address] [place] [address] [phone]                No Known Allergies  The results of significant diagnostics from this hospitalization (including imaging, microbiology, ancillary and laboratory) are listed below for reference.    Microbiology: Recent Results (from the past 240 hour(s))  Culture, blood  (Routine X 2) w Reflex to ID Panel     Status: None (Preliminary result)   Collection Time: 05/17/23  4:11 AM   Specimen: BLOOD  Result Value Ref Range Status   Specimen Description BLOOD BLOOD LEFT HAND  Final   Special Requests AEROBIC BOTTLE ONLY Blood Culture adequate volume  Final   Culture   Final    NO GROWTH 4 DAYS Performed at [facility], [address], [place], [place] [address]    Report Status PENDING  Incomplete  Culture, blood (Routine X 2) w Reflex to ID Panel     Status: None (Preliminary result)   Collection Time: 05/17/23  6:11 AM   Specimen: BLOOD  Result Value Ref Range Status   Specimen Description BLOOD RIGHT ANTECUBITAL  Final   Special Requests   Final    BOTTLES DRAWN AEROBIC AND ANAEROBIC Blood Culture adequate volume   Culture   Final    NO GROWTH 4 DAYS Performed at [facility], [address] [place], [place] [address]    Report Status PENDING  Incomplete  SARS Coronavirus 2 by RT PCR (hospital order, performed in [facility] hospital lab) *cepheid single result test* Anterior Nasal Swab     Status: None   Collection Time: 05/17/23  6:28 AM   Specimen: Anterior Nasal Swab  Result Value Ref Range Status   SARS Coronavirus 2 by RT PCR NEGATIVE NEGATIVE Final    Comment: Performed at [facility], [address], [place], [place] [address]  Respiratory (~20 pathogens) panel by PCR     Status: None   Collection Time: 05/17/23  6:28 AM   Specimen: Nasopharyngeal Swab; Respiratory  Result Value Ref Range Status   Adenovirus NOT DETECTED NOT DETECTED Final   Coronavirus 229E NOT DETECTED NOT DETECTED Final    Comment: (NOTE) The Coronavirus on the Respiratory Panel, DOES NOT test for the novel  Coronavirus (2019 nCoV)    Coronavirus HKU1 NOT DETECTED NOT DETECTED Final   Coronavirus NL63 NOT DETECTED NOT DETECTED Final   Coronavirus OC43 NOT DETECTED NOT DETECTED Final   Metapneumovirus NOT DETECTED NOT DETECTED Final    Rhinovirus / Enterovirus NOT DETECTED NOT DETECTED Final   Influenza A NOT DETECTED NOT DETECTED Final   Influenza B NOT DETECTED NOT DETECTED Final   Parainfluenza Virus 1 NOT DETECTED NOT DETECTED Final   Parainfluenza Virus 2 NOT DETECTED NOT DETECTED Final   Parainfluenza Virus 3 NOT DETECTED NOT DETECTED Final   Parainfluenza Virus 4 NOT DETECTED NOT DETECTED Final   Respiratory Syncytial Virus NOT DETECTED NOT DETECTED Final   Bordetella pertussis NOT DETECTED NOT DETECTED Final   Bordetella Parapertussis NOT DETECTED NOT DETECTED Final   Chlamydophila pneumoniae NOT DETECTED NOT DETECTED Final   Mycoplasma pneumoniae NOT DETECTED NOT DETECTED Final    Comment: Performed at [facility], [address] [place], [place] [address]  MRSA Next Gen by PCR, Nasal     Status: None   Collection Time: 05/17/23  9:09 AM   Specimen: Nasal Mucosa; Nasal Swab  Result Value Ref Range Status   MRSA by PCR Next Gen NOT DETECTED NOT DETECTED Final    Comment: (NOTE) The GeneXpert MRSA Assay (FDA approved for NASAL specimens only), is one component of a comprehensive MRSA colonization surveillance program. It is not intended to diagnose MRSA infection nor to guide or monitor treatment for MRSA infections. Test performance is not FDA approved in patients less than 3 years old. Performed at [facility], [address], [place], [place] [address]     Procedures/Studies: CT ANGIO GI BLEED  Result Date: 05/17/2023 CLINICAL DATA:  Lower GI bleed EXAM: CTA ABDOMEN AND PELVIS WITHOUT AND WITH CONTRAST TECHNIQUE: Multidetector CT imaging of the abdomen and pelvis was performed using the standard protocol during bolus administration of intravenous contrast. Multiplanar reconstructed images and MIPs were obtained and reviewed to evaluate the vascular anatomy. RADIATION DOSE REDUCTION: This exam was performed according to the  departmental dose-optimization program which includes automated  exposure control, adjustment of the mA and/or kV according to patient size and/or use of iterative reconstruction technique. CONTRAST:  OMNIPAQUE IOHEXOL 350 MG/ML SOLN COMPARISON:  05/16/2023 FINDINGS: Lower chest: Small right and trace left pleural effusions. Associated right lower lobe opacity, likely atelectasis. These are both new from the recent prior. Hepatobiliary: Cirrhosis.  No suspicious/enhancing hepatic lesions. Layering small gallstones with right carious excretion of contrast in the gallbladder. No associated inflammatory changes. Pancreas: Suspected 17 mm unilocular cyst pancreatic body (series 6/image 62), poorly visualized but grossly unchanged when compared to prior MRI. Spleen: Splenomegaly, measuring 20.7 cm in maximal craniocaudal dimension. Adrenals/Urinary Tract: Adrenal glands are within normal limits. 2.0 cm simple lateral interpolar left renal cyst. Right kidney is within normal limits. Mild left hydronephrosis, chronic. Bladder is within normal limits. Stomach/Bowel: Mild gastric wall thickening. No evidence of bowel obstruction. Appendix is not discretely visualized. Mild wall thickening involving the cecum/ascending colon, likely reflecting venous congestion. Rectosigmoid colon is mildly thick-walled although underdistended. No active intravasation of contrast into the colonic lumen to suggest lower GI bleeding. Vascular/Lymphatic: No evidence of abdominal aortic aneurysm. Portal vein is patent. Gastroesophageal varices. Recanalized periumbilical vein. Atherosclerotic calcifications of the abdominal aorta and branch vessels. Small upper abdominal lymph nodes, including a dominant 2.0 cm short axis portacaval node (series 6/image 86). Small retroperitoneal nodes which do not meet pathologic CT size criteria. These are likely reactive in the setting of cirrhosis. Reproductive: Mild prostatomegaly, with enlargement of the central gland, suggesting BPH. Other: Small volume  abdominopelvic ascites. Musculoskeletal: Degenerative changes of the visualized thoracolumbar spine. IMPRESSION: No evidence of active GI bleeding. Additional stable findings from yesterday's CTA abdomen/pelvis, as above. Electronically Signed   By: Charline Bills M.D.   On: 05/17/2023 18:07   CT ANGIO GI BLEED  Result Date: 05/16/2023 CLINICAL DATA:  Rectal bleeding, hypotension. EXAM: CTA ABDOMEN AND PELVIS WITHOUT AND WITH CONTRAST TECHNIQUE: Multidetector CT imaging of the abdomen and pelvis was performed using the standard protocol during bolus administration of intravenous contrast. Multiplanar reconstructed images and MIPs were obtained and reviewed to evaluate the vascular anatomy. Performed in conjunction with CTA of the chest, reported separately. RADIATION DOSE REDUCTION: This exam was performed according to the departmental dose-optimization program which includes automated exposure control, adjustment of the mA and/or kV according to patient size and/or use of iterative reconstruction technique. CONTRAST:  OMNIPAQUE IOHEXOL 350 MG/ML SOLN COMPARISON:  Abdominal ultrasound 09/23/2022, MRI 10/05/2022 FINDINGS: VASCULAR Aorta: Normal caliber aorta without aneurysm, dissection, vasculitis or significant stenosis. Mild to moderate atherosclerosis. Celiac: Patent without evidence of aneurysm, dissection, vasculitis or significant stenosis. SMA: Patent without evidence of aneurysm, dissection, vasculitis or significant stenosis. Renals: Single bilateral renal arteries. Both renal arteries are patent without evidence of aneurysm, dissection, vasculitis, fibromuscular dysplasia or significant stenosis. IMA: Patent, distal branches poorly assessed due to phase of contrast. Inflow: Patent without evidence of aneurysm, dissection, vasculitis or significant stenosis. Moderate atherosclerosis. Proximal Outflow: Bilateral common femoral and visualized portions of the superficial and profunda femoral  arteries are patent without evidence of aneurysm, dissection, vasculitis or significant stenosis. Veins: Venous phase imaging demonstrates sequela of portal hypertension with enlarged portal and splenic veins and numerous Paris off a gel varices. Additional varices in the anterior abdomen and left upper quadrant. Recannulized umbilical vein. Review of the MIP images confirms the above findings. NON-VASCULAR Lower chest: Assessed on concurrent chest CT, reported separately. Hepatobiliary: Cirrhotic hepatic morphology. No evidence of  focal hepatic lesion. Gallstones within nondistended gallbladder. No biliary dilatation. Pancreas: The cystic pancreatic lesion on prior MRI is not confidently seen on the current exam. There is generalized fatty atrophy. No ductal dilatation. No evidence of pancreatic inflammation. Spleen: Enlarged, 14.2 cm AP. No splenic infarct or focal abnormality. Adrenals/Urinary Tract: No adrenal nodule. Renal parapelvic cysts. No further follow-up imaging is recommended. No hydronephrosis or suspicious renal abnormality. Unremarkable urinary bladder. Stomach/Bowel: There is no contrast accumulation in the GI tract to localize site of GI bleed. Ovoid hyperdensity within the sigmoid is present on precontrast exam and likely represent an ingested pill. Occasional areas of colonic wall thickening but no discrete pericolonic edema. There is mild wall thickening about the third portion of the duodenum with adjacent fat stranding, nonspecific. No additional small bowel inflammation. No obstruction. Periesophageal and perigastric varices. No associated contrast extravasation. Lymphatic: Prominent central mesenteric lymph nodes are likely reactive. Enlarged portal caval node measuring 18 mm short axis series 5, image 45. Additional prominent periportal and porta hepatis nodes. Reproductive: The prostate gland is enlarged spanning 5.5 cm and causes mass effect on the bladder base. Other: Generalized edema  of the intra-abdominal mesenteric fat suggestive of third spacing. Trace upper abdominal ascites. No free intra-abdominal air. Musculoskeletal: Diffuse lumbar spondylosis. There are no acute or suspicious osseous abnormalities. IMPRESSION: VASCULAR 1. No contrast accumulation in the GI tract to localize site of GI bleed. 2. Sequela of portal hypertension with portosystemic collaterals. Paraesophageal and perigastric varices. 3.  Aortic Atherosclerosis (ICD10-I70.0). NON-VASCULAR 1. Cirrhosis.  No focal lesion. 2. Splenomegaly.  Trace upper abdominal ascites. 3. Prominent porta hepatis nodes likely reactive. 4. Nonspecific wall thickening of the third portion of the duodenum with adjacent fat stranding, possibility of duodenitis is considered. 5. Enlarged prostate gland. 6. The cystic pancreatic lesion on prior MRI is not definitively seen. 7. Gallstones. Electronically Signed   By: Narda Rutherford M.D.   On: 05/16/2023 16:58   CT Angio Chest Pulmonary Embolism (PE) W or WO Contrast  Result Date: 05/16/2023 CLINICAL DATA:  High clinical suspicion for PE EXAM: CT ANGIOGRAPHY CHEST WITH CONTRAST TECHNIQUE: Multidetector CT imaging of the chest was performed using the standard protocol during bolus administration of intravenous contrast. Multiplanar CT image reconstructions and MIPs were obtained to evaluate the vascular anatomy. RADIATION DOSE REDUCTION: This exam was performed according to the departmental dose-optimization program which includes automated exposure control, adjustment of the mA and/or kV according to patient size and/or use of iterative reconstruction technique. CONTRAST:  OMNIPAQUE IOHEXOL 350 MG/ML SOLN COMPARISON:  Chest radiograph done today FINDINGS: Cardiovascular: There is homogeneous enhancement in thoracic aorta. There are no intraluminal filling defects in central pulmonary artery branches. Motion artifacts limit evaluation of small peripheral pulmonary artery branches. Coronary  artery calcifications are seen. Mediastinum/Nodes: No significant abnormalities are seen. Lungs/Pleura: There is no focal pulmonary consolidation. There is no pleural effusion or pneumothorax. Upper Abdomen: There is nodularity of the liver surface suggesting possible cirrhosis. Minimal ascites is noted in upper abdomen adjacent to liver and spleen. Spleen appears to be enlarged in size. Spleen is not included in its entirety limiting evaluation of actual size. Small hiatal hernia is seen. There are ectatic vessels along the lesser curvature aspect of the stomach suggesting possible varices due to portal hypertension. There is recanalization of umbilical vein. Musculoskeletal: Degenerative changes are noted in cervical and thoracic spine. Review of the MIP images confirms the above findings. IMPRESSION: There is no evidence of central pulmonary artery  embolism. Evaluation of small peripheral pulmonary artery branches is limited by motion artifacts. There is no evidence of thoracic aortic dissection. Coronary artery calcifications are seen. There is no focal pulmonary consolidation. There is no pleural effusion or pneumothorax. Cirrhosis of liver. There are ectatic vessels along the lesser curvature aspect of stomach suggesting possible portal hypertension. Minimal ascites is seen in upper abdomen. Small hiatal hernia. Electronically Signed   By: Ernie Avena M.D.   On: 05/16/2023 16:50   DG Chest Portable 1 View  Result Date: 05/16/2023 CLINICAL DATA:  Hypotension, dizziness, diarrhea, and nausea EXAM: PORTABLE CHEST 1 VIEW COMPARISON:  Chest radiograph dated 03/22/2017 FINDINGS: Low lung volumes with bronchovascular crowding. No focal consolidations. No pleural effusion or pneumothorax. The heart size and mediastinal contours are within normal limits. No acute osseous abnormality. IMPRESSION: Low lung volumes with bronchovascular crowding. No focal consolidations. Electronically Signed   By: Agustin Cree  M.D.   On: 05/16/2023 16:43    Labs: BNP (last 3 results) No results for input(s): "BNP" in the last 8760 hours. Basic Metabolic Panel: Recent Labs  Lab 05/17/23 0133 05/17/23 1757 05/18/23 0028 05/19/23 0331 05/20/23 0748 05/21/23 0833  NA 135 140 138 138 136 136  K 4.6 4.2 3.7 3.4* 4.2 3.7  CL 102  --  107 105 101 103  CO2 19*  --  25 27 25 25   GLUCOSE 427*  --  233* 158* 322* 177*  BUN 33*  --  21 14 16 15   CREATININE 0.95  --  0.74 0.89 0.78 0.75  CALCIUM 8.6*  --  8.2* 8.2* 8.2* 8.3*   Liver Function Tests: Recent Labs  Lab 05/16/23 1307 05/17/23 0133 05/18/23 0028  AST 29 33 35  ALT 29 30 36  ALKPHOS 55 52 42  BILITOT 1.4* 1.2 1.9*  PROT 5.0* 5.0* 5.1*  ALBUMIN 2.9* 3.0* 2.8*   Recent Labs  Lab 05/16/23 1600  LIPASE 40   No results for input(s): "AMMONIA" in the last 168 hours. CBC: Recent Labs  Lab 05/18/23 2139 05/19/23 0331 05/19/23 1158 05/20/23 0748 05/21/23 0833  WBC 6.6 6.5 5.2 5.4 7.2  HGB 7.4* 7.7* 7.7* 7.9* 8.2*  HCT 25.7* 26.0* 27.3* 27.3* 28.0*  MCV 73.4* 72.8* 75.8* 73.4* 75.3*  PLT 103* 105* 103* 106* 121*   Recent Labs  Lab 05/20/23 1623 05/20/23 2150 05/20/23 2359 05/21/23 0651 05/21/23 1143  GLUCAP 332* 298* 241* 150* 228*      Component Value Date/Time   COLORURINE STRAW (A) 05/16/2023 1714   APPEARANCEUR CLEAR 05/16/2023 1714   LABSPEC 1.031 (H) 05/16/2023 1714   PHURINE 5.0 05/16/2023 1714   GLUCOSEU >=500 (A) 05/16/2023 1714   HGBUR NEGATIVE 05/16/2023 1714   BILIRUBINUR NEGATIVE 05/16/2023 1714   BILIRUBINUR negative 10/25/2015 1415   KETONESUR 5 (A) 05/16/2023 1714   PROTEINUR NEGATIVE 05/16/2023 1714   UROBILINOGEN 0.2 10/25/2015 1415   NITRITE NEGATIVE 05/16/2023 1714   LEUKOCYTESUR NEGATIVE 05/16/2023 1714   Sepsis Labs Recent Labs  Lab 05/19/23 0331 05/19/23 1158 05/20/23 0748 05/21/23 0833  WBC 6.5 5.2 5.4 7.2   Microbiology Recent Results (from the past 240 hour(s))  Culture, blood (Routine  X 2) w Reflex to ID Panel     Status: None (Preliminary result)   Collection Time: 05/17/23  4:11 AM   Specimen: BLOOD  Result Value Ref Range Status   Specimen Description BLOOD BLOOD LEFT HAND  Final   Special Requests AEROBIC BOTTLE ONLY Blood Culture adequate volume  Final   Culture   Final    NO GROWTH 4 DAYS Performed at [facility], [address], [place], [place] [address]    Report Status PENDING  Incomplete  Culture, blood (Routine X 2) w Reflex to ID Panel     Status: None (Preliminary result)   Collection Time: 05/17/23  6:11 AM   Specimen: BLOOD  Result Value Ref Range Status   Specimen Description BLOOD RIGHT ANTECUBITAL  Final   Special Requests   Final    BOTTLES DRAWN AEROBIC AND ANAEROBIC Blood Culture adequate volume   Culture   Final    NO GROWTH 4 DAYS Performed at [facility], [address], [place], [place] [address]    Report Status PENDING  Incomplete  SARS Coronavirus 2 by RT PCR (hospital order, performed in [facility] hospital lab) *cepheid single result test* Anterior Nasal Swab     Status: None   Collection Time: 05/17/23  6:28 AM   Specimen: Anterior Nasal Swab  Result Value Ref Range Status   SARS Coronavirus 2 by RT PCR NEGATIVE NEGATIVE Final    Comment: Performed at [facility], [address] [place], [place] [address]  Respiratory (~20 pathogens) panel by PCR     Status: None   Collection Time: 05/17/23  6:28 AM   Specimen: Nasopharyngeal Swab; Respiratory  Result Value Ref Range Status   Adenovirus NOT DETECTED NOT DETECTED Final   Coronavirus 229E NOT DETECTED NOT DETECTED Final    Comment: (NOTE) The Coronavirus on the Respiratory Panel, DOES NOT test for the novel  Coronavirus (2019 nCoV)    Coronavirus HKU1 NOT DETECTED NOT DETECTED Final   Coronavirus NL63 NOT DETECTED NOT DETECTED Final   Coronavirus OC43 NOT DETECTED NOT DETECTED Final   Metapneumovirus NOT DETECTED NOT DETECTED Final   Rhinovirus /  Enterovirus NOT DETECTED NOT DETECTED Final   Influenza A NOT DETECTED NOT DETECTED Final   Influenza B NOT DETECTED NOT DETECTED Final   Parainfluenza Virus 1 NOT DETECTED NOT DETECTED Final   Parainfluenza Virus 2 NOT DETECTED NOT DETECTED Final   Parainfluenza Virus 3 NOT DETECTED NOT DETECTED Final   Parainfluenza Virus 4 NOT DETECTED NOT DETECTED Final   Respiratory Syncytial Virus NOT DETECTED NOT DETECTED Final   Bordetella pertussis NOT DETECTED NOT DETECTED Final   Bordetella Parapertussis NOT DETECTED NOT DETECTED Final   Chlamydophila pneumoniae NOT DETECTED NOT DETECTED Final   Mycoplasma pneumoniae NOT DETECTED NOT DETECTED Final    Comment: Performed at [facility], [address], [place], [place] [address]  MRSA Next Gen by PCR, Nasal     Status: None   Collection Time: 05/17/23  9:09 AM   Specimen: Nasal Mucosa; Nasal Swab  Result Value Ref Range Status   MRSA by PCR Next Gen NOT DETECTED NOT DETECTED Final    Comment: (NOTE) The GeneXpert MRSA Assay (FDA approved for NASAL specimens only), is one component of a comprehensive MRSA colonization surveillance program. It is not intended to diagnose MRSA infection nor to guide or monitor treatment for MRSA infections. Test performance is not FDA approved in patients less than 74 years old. Performed at [facility], [address] [place] [address], [place], [place] [address]    Time coordinating discharge: 25 minutes  SIGNED: Lanae Boast, MD  [facility] 05/21/2023, 2:27 PM  If 7PM-7AM, please contact night-coverage [email or web address]

## 2023-05-21 NOTE — Progress Notes (Signed)
Patient refused BS check.

## 2023-05-22 ENCOUNTER — Encounter (HOSPITAL_COMMUNITY): Payer: Self-pay | Admitting: Gastroenterology

## 2023-05-23 ENCOUNTER — Encounter (HOSPITAL_COMMUNITY): Payer: Self-pay | Admitting: Gastroenterology

## 2023-05-24 DIAGNOSIS — K2981 Duodenitis with bleeding: Secondary | ICD-10-CM | POA: Diagnosis not present

## 2023-06-16 DIAGNOSIS — E1169 Type 2 diabetes mellitus with other specified complication: Secondary | ICD-10-CM | POA: Diagnosis not present

## 2023-06-16 DIAGNOSIS — K2981 Duodenitis with bleeding: Secondary | ICD-10-CM | POA: Diagnosis not present

## 2023-06-16 DIAGNOSIS — D649 Anemia, unspecified: Secondary | ICD-10-CM | POA: Diagnosis not present

## 2023-06-30 ENCOUNTER — Other Ambulatory Visit (HOSPITAL_COMMUNITY): Payer: Self-pay

## 2023-07-01 ENCOUNTER — Ambulatory Visit (HOSPITAL_COMMUNITY)
Admission: RE | Admit: 2023-07-01 | Discharge: 2023-07-01 | Disposition: A | Discharge status: Home / Self Care | Payer: BC Managed Care – PPO | Source: Ambulatory Visit | Attending: Internal Medicine | Admitting: Internal Medicine

## 2023-07-01 DIAGNOSIS — D509 Iron deficiency anemia, unspecified: Secondary | ICD-10-CM | POA: Insufficient documentation

## 2023-07-01 DIAGNOSIS — D649 Anemia, unspecified: Secondary | ICD-10-CM | POA: Insufficient documentation

## 2023-07-01 MED ORDER — SODIUM CHLORIDE 0.9 % IV SOLN
510.0000 mg | INTRAVENOUS | Status: DC
  Administered 2023-07-01: 510 mg via INTRAVENOUS
  Filled 2023-07-01: qty 510

## 2023-07-06 ENCOUNTER — Other Ambulatory Visit: Payer: Self-pay | Admitting: Gastroenterology

## 2023-07-06 DIAGNOSIS — E1169 Type 2 diabetes mellitus with other specified complication: Secondary | ICD-10-CM | POA: Diagnosis not present

## 2023-07-06 DIAGNOSIS — D696 Thrombocytopenia, unspecified: Secondary | ICD-10-CM | POA: Diagnosis not present

## 2023-07-06 DIAGNOSIS — D649 Anemia, unspecified: Secondary | ICD-10-CM | POA: Diagnosis not present

## 2023-07-06 DIAGNOSIS — I8500 Esophageal varices without bleeding: Secondary | ICD-10-CM | POA: Diagnosis not present

## 2023-07-06 DIAGNOSIS — K7460 Unspecified cirrhosis of liver: Secondary | ICD-10-CM | POA: Diagnosis not present

## 2023-07-06 DIAGNOSIS — D509 Iron deficiency anemia, unspecified: Secondary | ICD-10-CM | POA: Diagnosis not present

## 2023-07-06 DIAGNOSIS — K5190 Ulcerative colitis, unspecified, without complications: Secondary | ICD-10-CM | POA: Diagnosis not present

## 2023-07-08 ENCOUNTER — Ambulatory Visit (HOSPITAL_COMMUNITY)
Admission: RE | Admit: 2023-07-08 | Discharge: 2023-07-08 | Disposition: A | Discharge status: Home / Self Care | Payer: BC Managed Care – PPO | Source: Ambulatory Visit | Attending: Internal Medicine

## 2023-07-08 DIAGNOSIS — D649 Anemia, unspecified: Secondary | ICD-10-CM | POA: Diagnosis not present

## 2023-07-08 MED ORDER — SODIUM CHLORIDE 0.9 % IV SOLN
510.0000 mg | INTRAVENOUS | Status: DC
  Administered 2023-07-08: 510 mg via INTRAVENOUS
  Filled 2023-07-08: qty 510

## 2023-07-14 DIAGNOSIS — E1169 Type 2 diabetes mellitus with other specified complication: Secondary | ICD-10-CM | POA: Diagnosis not present

## 2023-07-20 ENCOUNTER — Encounter (HOSPITAL_COMMUNITY): Payer: Self-pay | Admitting: Gastroenterology

## 2023-07-20 NOTE — Progress Notes (Signed)
Attempted to obtain medical history for pre op call via telephone, unable to reach at this time. HIPAA compliant voicemail message left requesting return call to pre surgical testing department.

## 2023-07-27 ENCOUNTER — Ambulatory Visit (HOSPITAL_COMMUNITY): Payer: BC Managed Care – PPO | Admitting: Certified Registered"

## 2023-07-27 ENCOUNTER — Other Ambulatory Visit: Payer: Self-pay

## 2023-07-27 ENCOUNTER — Encounter (HOSPITAL_COMMUNITY): Payer: Self-pay | Admitting: Gastroenterology

## 2023-07-27 ENCOUNTER — Encounter (HOSPITAL_COMMUNITY)
Admission: RE | Disposition: A | Discharge status: Home / Self Care | Payer: Self-pay | Source: Home / Self Care | Attending: Gastroenterology

## 2023-07-27 ENCOUNTER — Ambulatory Visit (HOSPITAL_COMMUNITY)
Admission: RE | Admit: 2023-07-27 | Discharge: 2023-07-27 | Disposition: A | Discharge status: Home / Self Care | Payer: BC Managed Care – PPO | Attending: Gastroenterology | Admitting: Gastroenterology

## 2023-07-27 DIAGNOSIS — I8500 Esophageal varices without bleeding: Secondary | ICD-10-CM | POA: Diagnosis not present

## 2023-07-27 DIAGNOSIS — K5190 Ulcerative colitis, unspecified, without complications: Secondary | ICD-10-CM | POA: Diagnosis not present

## 2023-07-27 DIAGNOSIS — K3189 Other diseases of stomach and duodenum: Secondary | ICD-10-CM | POA: Diagnosis not present

## 2023-07-27 DIAGNOSIS — K7460 Unspecified cirrhosis of liver: Secondary | ICD-10-CM | POA: Diagnosis not present

## 2023-07-27 DIAGNOSIS — K766 Portal hypertension: Secondary | ICD-10-CM | POA: Insufficient documentation

## 2023-07-27 DIAGNOSIS — J45909 Unspecified asthma, uncomplicated: Secondary | ICD-10-CM | POA: Diagnosis not present

## 2023-07-27 DIAGNOSIS — Z09 Encounter for follow-up examination after completed treatment for conditions other than malignant neoplasm: Secondary | ICD-10-CM | POA: Diagnosis not present

## 2023-07-27 DIAGNOSIS — Z800 Family history of malignant neoplasm of digestive organs: Secondary | ICD-10-CM | POA: Diagnosis not present

## 2023-07-27 DIAGNOSIS — I8510 Secondary esophageal varices without bleeding: Secondary | ICD-10-CM | POA: Insufficient documentation

## 2023-07-27 HISTORY — PX: ESOPHAGOGASTRODUODENOSCOPY (EGD) WITH PROPOFOL: SHX5813

## 2023-07-27 LAB — GLUCOSE, CAPILLARY: Glucose-Capillary: 133 mg/dL — ABNORMAL HIGH (ref 70–99)

## 2023-07-27 SURGERY — ESOPHAGOGASTRODUODENOSCOPY (EGD) WITH PROPOFOL
Anesthesia: Monitor Anesthesia Care | Laterality: Bilateral

## 2023-07-27 MED ORDER — PROPOFOL 10 MG/ML IV BOLUS
INTRAVENOUS | Status: DC | PRN
  Administered 2023-07-27 (×2): 30 mg via INTRAVENOUS

## 2023-07-27 MED ORDER — PROPOFOL 500 MG/50ML IV EMUL
INTRAVENOUS | Status: DC | PRN
  Administered 2023-07-27: 135 ug/kg/min via INTRAVENOUS

## 2023-07-27 MED ORDER — PROPOFOL 500 MG/50ML IV EMUL
INTRAVENOUS | Status: AC
  Filled 2023-07-27: qty 100

## 2023-07-27 SURGICAL SUPPLY — 15 items

## 2023-07-27 NOTE — Transfer of Care (Signed)
Immediate Anesthesia Transfer of Care Note  Patient: Darren [name]  Procedure(s) Performed: ESOPHAGOGASTRODUODENOSCOPY (EGD) WITH PROPOFOL (Bilateral)  Patient Location: PACU  Anesthesia Type:MAC  Level of Consciousness: drowsy  Airway & Oxygen Therapy: Patient Spontanous Breathing and Patient connected to face mask oxygen  Post-op Assessment: Report given to RN, Post -op Vital signs reviewed and stable, and Patient moving all extremities X 4  Post vital signs: Reviewed and stable  Last Vitals:  Vitals Value Taken Time  BP 121/76   Temp    Pulse 87 07/27/23 0905  Resp 23 07/27/23 0905  SpO2 100 % 07/27/23 0905  Vitals shown include unfiled device data.  Last Pain:  Vitals:   07/27/23 0732  TempSrc: Tympanic  PainSc: 0-No pain         Complications: No notable events documented.

## 2023-07-27 NOTE — Discharge Instructions (Signed)

## 2023-07-27 NOTE — Anesthesia Preprocedure Evaluation (Signed)
Anesthesia Evaluation  Patient identified by MRN, date of birth, ID band Patient awake    Reviewed: Allergy & Precautions, NPO status , Patient's Chart, lab work & pertinent test results, reviewed documented beta blocker date and time   History of Anesthesia Complications Negative for: history of anesthetic complications  Airway Mallampati: II  TM Distance: >3 FB     Dental no notable dental hx.    Pulmonary neg shortness of breath, asthma , neg COPD, neg recent URI, neg PE   breath sounds clear to auscultation       Cardiovascular hypertension, (-) angina (-) CAD, (-) Past MI, (-) Cardiac Stents and (-) CHF  Rhythm:Regular Rate:Normal     Neuro/Psych neg Seizures    GI/Hepatic PUD,,,(+) Cirrhosis   Esophageal Varices      Endo/Other  neg diabetes    Renal/GU Renal disease     Musculoskeletal   Abdominal   Peds  Hematology  (+) Blood dyscrasia, anemia   Anesthesia Other Findings   Reproductive/Obstetrics                             Anesthesia Physical Anesthesia Plan  ASA: 3  Anesthesia Plan: MAC   Post-op Pain Management:    Induction: Intravenous  PONV Risk Score and Plan: 2 and Ondansetron and Dexamethasone  Airway Management Planned:   Additional Equipment:   Intra-op Plan:   Post-operative Plan:   Informed Consent: I have reviewed the patients History and Physical, chart, labs and discussed the procedure including the risks, benefits and alternatives for the proposed anesthesia with the patient or authorized representative who has indicated his/her understanding and acceptance.     Dental advisory given  Plan Discussed with: CRNA  Anesthesia Plan Comments:        Anesthesia Quick Evaluation

## 2023-07-27 NOTE — H&P (Signed)
[facility] H/P Note  Chief Complaint: cirrhosis, varices  HPI: Darren [name] is an 62 y.o. male.  Here for repeat EGD for possible further esophageal variceal banding.  No further hematemesis or melena.  Past Medical History:  Diagnosis Date   Allergy    Asthma    Gout    Ulcerative colitis (HCC)    followed by Buccini   Varicose veins     Past Surgical History:  Procedure Laterality Date   COLONOSCOPY WITH PROPOFOL N/A 05/19/2023   Procedure: COLONOSCOPY WITH PROPOFOL;  Surgeon: Kathi Der, MD;  Location: [facility];  Service: Gastroenterology;  Laterality: N/A;   ESOPHAGEAL BANDING  05/17/2023   Procedure: ESOPHAGEAL BANDING;  Surgeon: Kathi Der, MD;  Location: [facility]OS[facility];  Service: Gastroenterology;;   ESOPHAGOGASTRODUODENOSCOPY (EGD) WITH PROPOFOL N/A 05/17/2023   Procedure: ESOPHAGOGASTRODUODENOSCOPY (EGD) WITH PROPOFOL;  Surgeon: Kathi Der, MD;  Location: [facility];  Service: Gastroenterology;  Laterality: N/A;   FLEXIBLE SIGMOIDOSCOPY N/A 05/18/2023   Procedure: FLEXIBLE SIGMOIDOSCOPY;  Surgeon: Kathi Der, MD;  Location: [facility];  Service: Gastroenterology;  Laterality: N/A;   HEMOSTASIS CLIP PLACEMENT  05/19/2023   Procedure: HEMOSTASIS CLIP PLACEMENT;  Surgeon: Kathi Der, MD;  Location: [facility]OS[facility];  Service: Gastroenterology;;   HERNIA REPAIR      Medications Prior to Admission  Medication Sig Dispense Refill   albuterol (PROVENTIL HFA;VENTOLIN HFA) 108 (90 Base) MCG/ACT inhaler Inhale 2 puffs into the lungs every 6 (six) hours as needed. 1 Inhaler 2   Mesalamine 800 MG TBEC Take 1 tablet by mouth daily.     valsartan (DIOVAN) 160 MG tablet Take 160 mg by mouth daily.     allopurinol (ZYLOPRIM) 300 MG tablet Take 1 tablet (300 mg total) by mouth daily. 90 tablet 3   Blood Glucose Monitoring Suppl (BLOOD GLUCOSE MONITOR SYSTEM) w/Device KIT Use 3 (three) times daily. 1 kit 0   Glucose Blood (BLOOD GLUCOSE TEST  STRIPS) STRP Use as directed to check blood sugar 3 (three) times daily. . May dispense any manufacturer covered by patient's insurance and fits patient's device. 100 strip 0   insulin aspart (NOVOLOG) 100 UNIT/ML FlexPen Inject 5 Units into the skin 3 (three) times daily with meals. Only take if eating a meal AND Blood Glucose (BG) is 80 or higher. 15 mL 0   insulin glargine-yfgn (SEMGLEE) 100 UNIT/ML Pen Inject 30 Units into the skin daily. May substitute as needed per insurance. 15 mL 0   Insulin Pen Needle 32G X 4 MM MISC Use with insulin pens. 100 each 0   Lancet Device MISC 1 each by Does not apply route 3 (three) times daily. May dispense any manufacturer covered by patient's insurance. 1 each 0   Lancets (ONETOUCH DELICA PLUS LANCET30G) MISC 1 each 3 (three) times daily. 100 each 0   pantoprazole (PROTONIX) 40 MG tablet Take 1 tablet (40 mg total) by mouth 2 (two) times daily. 60 tablet 0   predniSONE (DELTASONE) 10 MG tablet Take 4 tabs daily x 7 days , 3 tabs daily x7 days, 2 tabs daily x7 days, 1 tab daily x 7 days, then stop 70 tablet 0    Allergies: No Known Allergies  Family History  Problem Relation Age of Onset   Cancer Father        colon cancer    Social History:  reports that he has never smoked. He has never used smokeless tobacco. He reports current alcohol use. He reports that he does not use  drugs.   ROS: As per HPI, all others negative   Blood pressure 125/69, pulse 83, temperature 98.1 F (36.7 C), temperature source Tympanic, resp. rate (!) 22, height 6\' 1"  (1.854 m), weight 108 kg, SpO2 96%. General appearance: NAD HEENT:  Anicteric CV:  regular RESP:  No distress ABD:  Soft, non-tender NEURO:  No encephalopathy  Results for orders placed or performed during the hospital encounter of 07/27/23 (from the past 48 hour(s))  Glucose, capillary     Status: Abnormal   Collection Time: 07/27/23  7:44 AM  Result Value Ref Range   Glucose-Capillary 133 (H) 70 -  99 mg/dL    Comment: Glucose reference range applies only to samples taken after fasting for at least 8 hours.   No results found.  Assessment/Plan   Cirrhosis. Esophageal varices. Repeat endoscopy with possible further banding. Risks (bleeding, infection, bowel perforation that could require surgery, sedation-related changes in cardiopulmonary systems), benefits (identification and possible treatment of source of symptoms, exclusion of certain causes of symptoms), and alternatives (watchful waiting, radiographic imaging studies, empiric medical treatment) of upper endoscopy with possible further esophageal variceal band application (EGD) were explained to patient/family in detail and patient wishes to proceed.   [name] 07/27/2023, 8:31 AM

## 2023-07-27 NOTE — Op Note (Signed)
[facility] Patient Name: Darren [name] Procedure Date: 07/27/2023 MRN: [id number] Attending MD: Willis Modena , MD, [id number] Date of Birth: 14-Jul-1961 CSN: [id number] Age: 62 Admit Type: Outpatient Procedure:                Upper GI endoscopy Indications:              Follow-up of esophageal varices Providers:                Willis Modena, MD, Stephens Shire RN, RN, Kandice Robinsons, Technician Referring MD:              Medicines:                Monitored Anesthesia Care Complications:            No immediate complications. Estimated Blood Loss:     Estimated blood loss: none. Procedure:                Pre-Anesthesia Assessment:                           - Prior to the procedure, a History and Physical                            was performed, and patient medications and                            allergies were reviewed. The patient's tolerance of                            previous anesthesia was also reviewed. The risks                            and benefits of the procedure and the sedation                            options and risks were discussed with the patient.                            All questions were answered, and informed consent                            was obtained. Prior Anticoagulants: The patient has                            taken no anticoagulant or antiplatelet agents. ASA                            Grade Assessment: III - A patient with severe                            systemic disease. After reviewing the risks and  benefits, the patient was deemed in satisfactory                            condition to undergo the procedure.                           After obtaining informed consent, the endoscope was                            passed under direct vision. Throughout the                            procedure, the patient's blood pressure, pulse, and                             oxygen saturations were monitored continuously. The                            [id number]-[id number] ([id number]) Olympus endoscope was introduced                            through the mouth, and advanced to the second part                            of duodenum. The upper GI endoscopy was                            accomplished without difficulty. The patient                            tolerated the procedure well. Scope In: Scope Out: Findings:      Grade I, small (< 5 mm) varices were found in the lower third of the       esophagus. They were small in size. Estimated blood loss: none.      The exam of the esophagus was otherwise normal.      Moderate portal hypertensive gastropathy was found in the entire       examined stomach.      The exam of the stomach was otherwise normal.      The duodenal bulb, first portion of the duodenum and second portion of       the duodenum were normal. Impression:               - Grade I and small (< 5 mm) esophageal varices.                           - Portal hypertensive gastropathy.                           - Normal duodenal bulb, first portion of the                            duodenum and second portion of the duodenum.                           -  No specimens collected. Moderate Sedation:      Not Applicable - Patient had care per Anesthesia. Recommendation:           - Patient has a contact number available for                            emergencies. The signs and symptoms of potential                            delayed complications were discussed with the                            patient. Return to normal activities tomorrow.                            Written discharge instructions were provided to the                            patient.                           - Discharge patient to home (via wheelchair).                           - Resume previous diet today.                           - Continue present medications.                           -  Return to GI clinic in 3 months.                           - Return to referring physician as previously                            scheduled. Procedure Code(s):        --- Professional ---                           [phone], Esophagogastroduodenoscopy, flexible,                            transoral; diagnostic, including collection of                            specimen(s) by brushing or washing, when performed                            (separate procedure) Diagnosis Code(s):        --- Professional ---                           I85.00, Esophageal varices without bleeding                           K76.6, Portal hypertension  K31.89, Other diseases of stomach and duodenum CPT copyright 2022 [employer]. All rights reserved. The codes documented in this report are preliminary and upon coder review may  be revised to meet current compliance requirements. Willis Modena, MD 07/27/2023 9:14:00 AM This report has been signed electronically. Number of Addenda: 0

## 2023-07-27 NOTE — Anesthesia Postprocedure Evaluation (Signed)
Anesthesia Post Note  Patient: Luevenia Maxin  Procedure(s) Performed: ESOPHAGOGASTRODUODENOSCOPY (EGD) WITH PROPOFOL (Bilateral)     Patient location during evaluation: PACU Anesthesia Type: MAC Level of consciousness: awake and alert Pain management: pain level controlled Vital Signs Assessment: post-procedure vital signs reviewed and stable Respiratory status: spontaneous breathing, nonlabored ventilation, respiratory function stable and patient connected to nasal cannula oxygen Cardiovascular status: stable and blood pressure returned to baseline Postop Assessment: no apparent nausea or vomiting Anesthetic complications: no   No notable events documented.  Last Vitals:  Vitals:   07/27/23 0910 07/27/23 0920  BP: 120/67 110/66  Pulse: 86 75  Resp: (!) 25 (!) 21  Temp:    SpO2: 100% 98%    Last Pain:  Vitals:   07/27/23 0920  TempSrc:   PainSc: 0-No pain                 Mariann Barter

## 2023-07-27 NOTE — Anesthesia Procedure Notes (Signed)
Procedure Name: MAC Date/Time: 07/27/2023 8:50 AM  Performed by: Nelle Don, CRNAPre-anesthesia Checklist: Patient identified, Emergency Drugs available, Suction available and Patient being monitored Oxygen Delivery Method: Simple face mask

## 2023-07-28 ENCOUNTER — Encounter (HOSPITAL_COMMUNITY): Payer: Self-pay | Admitting: Gastroenterology

## 2023-09-05 DIAGNOSIS — E1169 Type 2 diabetes mellitus with other specified complication: Secondary | ICD-10-CM | POA: Diagnosis not present

## 2023-09-05 DIAGNOSIS — Z125 Encounter for screening for malignant neoplasm of prostate: Secondary | ICD-10-CM | POA: Diagnosis not present

## 2023-09-05 DIAGNOSIS — R82998 Other abnormal findings in urine: Secondary | ICD-10-CM | POA: Diagnosis not present

## 2023-09-05 DIAGNOSIS — D509 Iron deficiency anemia, unspecified: Secondary | ICD-10-CM | POA: Diagnosis not present

## 2023-09-05 DIAGNOSIS — M109 Gout, unspecified: Secondary | ICD-10-CM | POA: Diagnosis not present

## 2023-09-05 DIAGNOSIS — I10 Essential (primary) hypertension: Secondary | ICD-10-CM | POA: Diagnosis not present

## 2023-09-12 DIAGNOSIS — Z0000 Encounter for general adult medical examination without abnormal findings: Secondary | ICD-10-CM | POA: Diagnosis not present

## 2023-09-12 DIAGNOSIS — Z1331 Encounter for screening for depression: Secondary | ICD-10-CM | POA: Diagnosis not present

## 2023-09-12 DIAGNOSIS — Z1339 Encounter for screening examination for other mental health and behavioral disorders: Secondary | ICD-10-CM | POA: Diagnosis not present

## 2023-09-12 DIAGNOSIS — N390 Urinary tract infection, site not specified: Secondary | ICD-10-CM | POA: Diagnosis not present

## 2023-09-12 DIAGNOSIS — K7460 Unspecified cirrhosis of liver: Secondary | ICD-10-CM | POA: Diagnosis not present

## 2023-09-12 DIAGNOSIS — I10 Essential (primary) hypertension: Secondary | ICD-10-CM | POA: Diagnosis not present

## 2023-09-16 ENCOUNTER — Other Ambulatory Visit (HOSPITAL_BASED_OUTPATIENT_CLINIC_OR_DEPARTMENT_OTHER): Payer: Self-pay | Admitting: Gastroenterology

## 2023-09-16 DIAGNOSIS — K862 Cyst of pancreas: Secondary | ICD-10-CM

## 2023-10-06 ENCOUNTER — Ambulatory Visit (HOSPITAL_BASED_OUTPATIENT_CLINIC_OR_DEPARTMENT_OTHER)
Admission: RE | Admit: 2023-10-06 | Discharge: 2023-10-06 | Disposition: A | Discharge status: Home / Self Care | Payer: BC Managed Care – PPO | Source: Ambulatory Visit | Attending: Gastroenterology | Admitting: Gastroenterology

## 2023-10-06 DIAGNOSIS — K7460 Unspecified cirrhosis of liver: Secondary | ICD-10-CM | POA: Diagnosis not present

## 2023-10-06 DIAGNOSIS — K862 Cyst of pancreas: Secondary | ICD-10-CM | POA: Insufficient documentation

## 2023-10-06 DIAGNOSIS — R162 Hepatomegaly with splenomegaly, not elsewhere classified: Secondary | ICD-10-CM | POA: Diagnosis not present

## 2023-10-06 DIAGNOSIS — K766 Portal hypertension: Secondary | ICD-10-CM | POA: Diagnosis not present

## 2023-10-06 DIAGNOSIS — R188 Other ascites: Secondary | ICD-10-CM | POA: Diagnosis not present

## 2023-10-06 MED ORDER — GADOBUTROL 1 MMOL/ML IV SOLN
10.0000 mL | Freq: Once | INTRAVENOUS | Status: AC | PRN
  Administered 2023-10-06: 10 mL via INTRAVENOUS
  Filled 2023-10-06: qty 10

## 2023-10-09 ENCOUNTER — Ambulatory Visit (HOSPITAL_BASED_OUTPATIENT_CLINIC_OR_DEPARTMENT_OTHER): Payer: BC Managed Care – PPO

## 2023-12-12 DIAGNOSIS — K7460 Unspecified cirrhosis of liver: Secondary | ICD-10-CM | POA: Diagnosis not present

## 2023-12-12 DIAGNOSIS — E669 Obesity, unspecified: Secondary | ICD-10-CM | POA: Diagnosis not present

## 2023-12-12 DIAGNOSIS — I10 Essential (primary) hypertension: Secondary | ICD-10-CM | POA: Diagnosis not present

## 2024-05-07 DIAGNOSIS — R051 Acute cough: Secondary | ICD-10-CM | POA: Diagnosis not present

## 2024-05-07 DIAGNOSIS — R0981 Nasal congestion: Secondary | ICD-10-CM | POA: Diagnosis not present

## 2024-05-07 DIAGNOSIS — J209 Acute bronchitis, unspecified: Secondary | ICD-10-CM | POA: Diagnosis not present

## 2024-05-13 DIAGNOSIS — J989 Respiratory disorder, unspecified: Secondary | ICD-10-CM | POA: Diagnosis not present

## 2024-05-13 DIAGNOSIS — H6123 Impacted cerumen, bilateral: Secondary | ICD-10-CM | POA: Diagnosis not present

## 2024-05-31 DIAGNOSIS — M25561 Pain in right knee: Secondary | ICD-10-CM | POA: Diagnosis not present

## 2024-05-31 DIAGNOSIS — M1711 Unilateral primary osteoarthritis, right knee: Secondary | ICD-10-CM | POA: Diagnosis not present

## 2024-07-10 DIAGNOSIS — M170 Bilateral primary osteoarthritis of knee: Secondary | ICD-10-CM | POA: Diagnosis not present
